# Patient Record
Sex: Male | Born: 1965 | Race: Black or African American | Hispanic: No | Marital: Married | Smoking: Never smoker
Health system: Southern US, Community
[De-identification: ages and names within clinical notes are randomized; demographics above are authoritative.]

## PROBLEM LIST (undated history)

## (undated) DIAGNOSIS — M1990 Unspecified osteoarthritis, unspecified site: Secondary | ICD-10-CM

## (undated) DIAGNOSIS — M87051 Idiopathic aseptic necrosis of right femur: Secondary | ICD-10-CM

## (undated) DIAGNOSIS — M2342 Loose body in knee, left knee: Secondary | ICD-10-CM

## (undated) DIAGNOSIS — S060XAA Concussion with loss of consciousness status unknown, initial encounter: Secondary | ICD-10-CM

## (undated) DIAGNOSIS — S060X9A Concussion with loss of consciousness of unspecified duration, initial encounter: Secondary | ICD-10-CM

## (undated) DIAGNOSIS — K219 Gastro-esophageal reflux disease without esophagitis: Secondary | ICD-10-CM

---

## 2003-01-09 HISTORY — PX: ANKLE FUSION: SHX881

## 2012-06-18 ENCOUNTER — Encounter (HOSPITAL_COMMUNITY): Payer: Self-pay | Admitting: Pharmacy Technician

## 2012-06-23 ENCOUNTER — Encounter (HOSPITAL_COMMUNITY)
Admission: RE | Admit: 2012-06-23 | Discharge: 2012-06-23 | Disposition: A | Discharge status: Home / Self Care | Payer: Worker's Compensation | Source: Ambulatory Visit | Attending: Orthopedic Surgery | Admitting: Orthopedic Surgery

## 2012-06-23 ENCOUNTER — Encounter (HOSPITAL_COMMUNITY): Payer: Self-pay

## 2012-06-23 HISTORY — DX: Unspecified osteoarthritis, unspecified site: M19.90

## 2012-06-23 HISTORY — DX: Concussion with loss of consciousness of unspecified duration, initial encounter: S06.0X9A

## 2012-06-23 HISTORY — DX: Concussion with loss of consciousness status unknown, initial encounter: S06.0XAA

## 2012-06-23 HISTORY — DX: Gastro-esophageal reflux disease without esophagitis: K21.9

## 2012-06-23 LAB — CBC
HCT: 42.1 % (ref 39.0–52.0)
Hemoglobin: 13.9 g/dL (ref 13.0–17.0)
MCH: 23.6 pg — ABNORMAL LOW (ref 26.0–34.0)
MCHC: 33 g/dL (ref 30.0–36.0)
MCV: 71.4 fL — ABNORMAL LOW (ref 78.0–100.0)
Platelets: 112 K/uL — ABNORMAL LOW (ref 150–400)
RBC: 5.9 MIL/uL — ABNORMAL HIGH (ref 4.22–5.81)
RDW: 14.5 % (ref 11.5–15.5)
WBC: 4.8 K/uL (ref 4.0–10.5)

## 2012-06-23 LAB — TYPE AND SCREEN
ABO/RH(D): O POS
Antibody Screen: NEGATIVE

## 2012-06-23 LAB — SURGICAL PCR SCREEN: MRSA, PCR: NEGATIVE

## 2012-06-23 LAB — ABO/RH: ABO/RH(D): O POS

## 2012-06-23 NOTE — Pre-Procedure Instructions (Signed)
Jason Sanchez  06/23/2012   Your procedure is scheduled on:  Tuesday, June 24th.  Report to [facility] at 5;30 AM.  Call this number if you have problems the morning of surgery: [phone]   Remember:   Do not eat food or drink liquids after midnight.    Take these medicines the morning of surgery with A SIP OF WATER: May take if needed:oxyCODONE-acetaminophen (PERCOCET/ROXICET).    Do not wear jewelry, make-up or nail polish.  Do not wear lotions, powders, or perfumes. You may wear deodorant.   Men may shave face and neck.  Do not bring valuables to the hospital.  [facility] is not responsible  for any belongings or valuables.  Contacts, dentures or bridgework may not be worn into surgery.  Leave suitcase in the car. After surgery it may be brought to your room.  For patients admitted to the hospital, checkout time is 11:00 AM the day of discharge.    Special Instructions: Shower with Special Wash you were given Sunday, June 22n  And Monday, June 23rd.  If you choose to take a shower the morning of surgery use the wash as instructed for Sunday and Monday.   Please read over the following fact sheets that you were given: Pain Booklet, Coughing and Deep Breathing, Blood Transfusion Information and Surgical Site Infection Prevention

## 2012-06-24 ENCOUNTER — Other Ambulatory Visit: Payer: Self-pay | Admitting: Orthopedic Surgery

## 2012-06-30 MED ORDER — VANCOMYCIN HCL IN DEXTROSE 1-5 GM/200ML-% IV SOLN
1000.0000 mg | INTRAVENOUS | Status: AC
  Administered 2012-07-01: 1000 mg via INTRAVENOUS
  Filled 2012-06-30: qty 200

## 2012-07-01 ENCOUNTER — Encounter (HOSPITAL_COMMUNITY): Payer: Self-pay | Admitting: Surgery

## 2012-07-01 ENCOUNTER — Inpatient Hospital Stay (HOSPITAL_COMMUNITY)
Admission: RE | Admit: 2012-07-01 | Discharge: 2012-07-03 | DRG: 470 | Disposition: A | Discharge status: Home Health | Payer: Worker's Compensation | Source: Ambulatory Visit | Attending: Orthopedic Surgery | Admitting: Orthopedic Surgery

## 2012-07-01 ENCOUNTER — Encounter (HOSPITAL_COMMUNITY): Payer: Self-pay | Admitting: Anesthesiology

## 2012-07-01 ENCOUNTER — Inpatient Hospital Stay (HOSPITAL_COMMUNITY): Payer: Worker's Compensation | Admitting: Anesthesiology

## 2012-07-01 ENCOUNTER — Encounter (HOSPITAL_COMMUNITY)
Admission: RE | Disposition: A | Discharge status: Home Health | Payer: Self-pay | Source: Ambulatory Visit | Attending: Orthopedic Surgery

## 2012-07-01 ENCOUNTER — Inpatient Hospital Stay (HOSPITAL_COMMUNITY): Payer: Worker's Compensation

## 2012-07-01 DIAGNOSIS — Z01812 Encounter for preprocedural laboratory examination: Secondary | ICD-10-CM

## 2012-07-01 DIAGNOSIS — Z79899 Other long term (current) drug therapy: Secondary | ICD-10-CM

## 2012-07-01 DIAGNOSIS — M87051 Idiopathic aseptic necrosis of right femur: Secondary | ICD-10-CM

## 2012-07-01 DIAGNOSIS — K219 Gastro-esophageal reflux disease without esophagitis: Secondary | ICD-10-CM | POA: Diagnosis present

## 2012-07-01 DIAGNOSIS — M87059 Idiopathic aseptic necrosis of unspecified femur: Principal | ICD-10-CM | POA: Diagnosis present

## 2012-07-01 HISTORY — DX: Idiopathic aseptic necrosis of right femur: M87.051

## 2012-07-01 HISTORY — PX: TOTAL HIP ARTHROPLASTY: SHX124

## 2012-07-01 LAB — CBC
HCT: 40.7 % (ref 39.0–52.0)
MCV: 71.3 fL — ABNORMAL LOW (ref 78.0–100.0)
Platelets: 96 10*3/uL — ABNORMAL LOW (ref 150–400)
RBC: 5.71 MIL/uL (ref 4.22–5.81)
WBC: 4.5 10*3/uL (ref 4.0–10.5)

## 2012-07-01 LAB — BASIC METABOLIC PANEL
BUN: 17 mg/dL (ref 6–23)
CO2: 26 mEq/L (ref 19–32)
Chloride: 107 mEq/L (ref 96–112)
Creatinine, Ser: 1.09 mg/dL (ref 0.50–1.35)
Potassium: 4.1 mEq/L (ref 3.5–5.1)

## 2012-07-01 LAB — PROTIME-INR: INR: 1.09 (ref 0.00–1.49)

## 2012-07-01 SURGERY — ARTHROPLASTY, HIP, TOTAL,POSTERIOR APPROACH
Anesthesia: General | Site: Hip | Laterality: Right | Wound class: Clean

## 2012-07-01 MED ORDER — METHOCARBAMOL 500 MG PO TABS
500.0000 mg | ORAL_TABLET | Freq: Four times a day (QID) | ORAL | Status: DC | PRN
  Administered 2012-07-01 – 2012-07-03 (×5): 500 mg via ORAL
  Filled 2012-07-01 (×4): qty 1

## 2012-07-01 MED ORDER — METHOCARBAMOL 100 MG/ML IJ SOLN
500.0000 mg | Freq: Four times a day (QID) | INTRAVENOUS | Status: DC | PRN
  Filled 2012-07-01: qty 5

## 2012-07-01 MED ORDER — MIDAZOLAM HCL 5 MG/5ML IJ SOLN
INTRAMUSCULAR | Status: DC | PRN
  Administered 2012-07-01: 2 mg via INTRAVENOUS

## 2012-07-01 MED ORDER — METHOCARBAMOL 500 MG PO TABS: 500.0000 mg | ORAL_TABLET | Freq: Four times a day (QID) | ORAL | Status: DC

## 2012-07-01 MED ORDER — HYDROMORPHONE HCL PF 1 MG/ML IJ SOLN
INTRAMUSCULAR | Status: AC
  Filled 2012-07-01: qty 2

## 2012-07-01 MED ORDER — METOCLOPRAMIDE HCL 10 MG PO TABS
5.0000 mg | ORAL_TABLET | Freq: Three times a day (TID) | ORAL | Status: DC | PRN
Start: 2012-07-01 — End: 2012-07-03

## 2012-07-01 MED ORDER — VANCOMYCIN HCL IN DEXTROSE 1-5 GM/200ML-% IV SOLN
1000.0000 mg | Freq: Two times a day (BID) | INTRAVENOUS | Status: AC
  Administered 2012-07-01: 1000 mg via INTRAVENOUS
  Filled 2012-07-01: qty 200

## 2012-07-01 MED ORDER — ROCURONIUM BROMIDE 100 MG/10ML IV SOLN
INTRAVENOUS | Status: DC | PRN
  Administered 2012-07-01: 70 mg via INTRAVENOUS

## 2012-07-01 MED ORDER — FENTANYL CITRATE 0.05 MG/ML IJ SOLN
INTRAMUSCULAR | Status: DC | PRN
  Administered 2012-07-01: 50 ug via INTRAVENOUS
  Administered 2012-07-01: 100 ug via INTRAVENOUS
  Administered 2012-07-01: 250 ug via INTRAVENOUS
  Administered 2012-07-01: 100 ug via INTRAVENOUS

## 2012-07-01 MED ORDER — ENOXAPARIN SODIUM 40 MG/0.4ML ~~LOC~~ SOLN: 40.0000 mg | SUBCUTANEOUS | Status: DC

## 2012-07-01 MED ORDER — ONDANSETRON HCL 4 MG/2ML IJ SOLN
INTRAMUSCULAR | Status: DC | PRN
  Administered 2012-07-01: 4 mg via INTRAVENOUS

## 2012-07-01 MED ORDER — PROPOFOL 10 MG/ML IV BOLUS
INTRAVENOUS | Status: DC | PRN
  Administered 2012-07-01: 50 mg via INTRAVENOUS
  Administered 2012-07-01: 200 mg via INTRAVENOUS

## 2012-07-01 MED ORDER — DEXAMETHASONE SODIUM PHOSPHATE 4 MG/ML IJ SOLN
INTRAMUSCULAR | Status: DC | PRN
  Administered 2012-07-01: 4 mg via INTRAVENOUS

## 2012-07-01 MED ORDER — OXYCODONE HCL 5 MG/5ML PO SOLN: 5.0000 mg | Freq: Once | ORAL | Status: DC | PRN

## 2012-07-01 MED ORDER — PROMETHAZINE HCL 25 MG/ML IJ SOLN: 6.2500 mg | INTRAMUSCULAR | Status: DC | PRN

## 2012-07-01 MED ORDER — HYDROMORPHONE HCL PF 1 MG/ML IJ SOLN
0.2500 mg | INTRAMUSCULAR | Status: DC | PRN
  Administered 2012-07-01 (×4): 0.5 mg via INTRAVENOUS

## 2012-07-01 MED ORDER — ONDANSETRON HCL 4 MG PO TABS
4.0000 mg | ORAL_TABLET | Freq: Four times a day (QID) | ORAL | Status: DC | PRN
  Administered 2012-07-02: 4 mg via ORAL
  Filled 2012-07-01: qty 1

## 2012-07-01 MED ORDER — OXYCODONE-ACETAMINOPHEN 10-325 MG PO TABS: 1.0000 | ORAL_TABLET | Freq: Four times a day (QID) | ORAL | Status: DC | PRN

## 2012-07-01 MED ORDER — ENOXAPARIN SODIUM 40 MG/0.4ML ~~LOC~~ SOLN
40.0000 mg | SUBCUTANEOUS | Status: DC
  Administered 2012-07-01 – 2012-07-02 (×2): 40 mg via SUBCUTANEOUS
  Filled 2012-07-01 (×3): qty 0.4

## 2012-07-01 MED ORDER — BUPIVACAINE HCL (PF) 0.25 % IJ SOLN
INTRAMUSCULAR | Status: AC
  Filled 2012-07-01: qty 30

## 2012-07-01 MED ORDER — ALUM & MAG HYDROXIDE-SIMETH 200-200-20 MG/5ML PO SUSP: 30.0000 mL | ORAL | Status: DC | PRN

## 2012-07-01 MED ORDER — MAGNESIUM CITRATE PO SOLN
1.0000 | Freq: Once | ORAL | Status: AC | PRN
  Filled 2012-07-01: qty 296

## 2012-07-01 MED ORDER — SENNA-DOCUSATE SODIUM 8.6-50 MG PO TABS: 2.0000 | ORAL_TABLET | Freq: Every day | ORAL | Status: DC

## 2012-07-01 MED ORDER — DIPHENHYDRAMINE HCL 12.5 MG/5ML PO ELIX: 12.5000 mg | ORAL_SOLUTION | ORAL | Status: DC | PRN

## 2012-07-01 MED ORDER — MENTHOL 3 MG MT LOZG
1.0000 | LOZENGE | OROMUCOSAL | Status: DC | PRN
  Administered 2012-07-03: 3 mg via ORAL
  Filled 2012-07-01: qty 9

## 2012-07-01 MED ORDER — MIDAZOLAM HCL 2 MG/2ML IJ SOLN
INTRAMUSCULAR | Status: AC
  Filled 2012-07-01: qty 2

## 2012-07-01 MED ORDER — SODIUM CHLORIDE 0.9 % IR SOLN
Status: DC | PRN
  Administered 2012-07-01: 1000 mL

## 2012-07-01 MED ORDER — NEOSTIGMINE METHYLSULFATE 1 MG/ML IJ SOLN
INTRAMUSCULAR | Status: DC | PRN
  Administered 2012-07-01: 3 mg via INTRAVENOUS

## 2012-07-01 MED ORDER — BUPIVACAINE HCL 0.25 % IJ SOLN
INTRAMUSCULAR | Status: DC | PRN
  Administered 2012-07-01: 10 mL

## 2012-07-01 MED ORDER — POTASSIUM CHLORIDE IN NACL 20-0.45 MEQ/L-% IV SOLN
INTRAVENOUS | Status: DC
  Administered 2012-07-01: 75 mL via INTRAVENOUS
  Administered 2012-07-02: 07:00:00 via INTRAVENOUS
  Filled 2012-07-01 (×7): qty 1000

## 2012-07-01 MED ORDER — ACETAMINOPHEN 650 MG RE SUPP: 650.0000 mg | Freq: Four times a day (QID) | RECTAL | Status: DC | PRN

## 2012-07-01 MED ORDER — OXYCODONE HCL 5 MG PO TABS
5.0000 mg | ORAL_TABLET | ORAL | Status: DC | PRN
  Administered 2012-07-01 – 2012-07-02 (×5): 10 mg via ORAL
  Administered 2012-07-02: 5 mg via ORAL
  Administered 2012-07-03: 10 mg via ORAL
  Filled 2012-07-01 (×7): qty 2

## 2012-07-01 MED ORDER — BISACODYL 10 MG RE SUPP: 10.0000 mg | Freq: Every day | RECTAL | Status: DC | PRN

## 2012-07-01 MED ORDER — HYDROMORPHONE HCL PF 1 MG/ML IJ SOLN
1.0000 mg | INTRAMUSCULAR | Status: DC | PRN
  Administered 2012-07-01 – 2012-07-02 (×5): 1 mg via INTRAVENOUS
  Filled 2012-07-01 (×5): qty 1

## 2012-07-01 MED ORDER — DOCUSATE SODIUM 100 MG PO CAPS
100.0000 mg | ORAL_CAPSULE | Freq: Two times a day (BID) | ORAL | Status: DC
  Administered 2012-07-01 – 2012-07-03 (×4): 100 mg via ORAL
  Filled 2012-07-01 (×5): qty 1

## 2012-07-01 MED ORDER — LACTATED RINGERS IV SOLN
INTRAVENOUS | Status: DC | PRN
  Administered 2012-07-01 (×3): via INTRAVENOUS

## 2012-07-01 MED ORDER — LIDOCAINE HCL (CARDIAC) 20 MG/ML IV SOLN
INTRAVENOUS | Status: DC | PRN
  Administered 2012-07-01: 20 mg via INTRAVENOUS

## 2012-07-01 MED ORDER — OXYCODONE HCL 5 MG PO TABS: 5.0000 mg | ORAL_TABLET | Freq: Once | ORAL | Status: DC | PRN

## 2012-07-01 MED ORDER — ONDANSETRON HCL 4 MG/2ML IJ SOLN: 4.0000 mg | Freq: Four times a day (QID) | INTRAMUSCULAR | Status: DC | PRN

## 2012-07-01 MED ORDER — ACETAMINOPHEN 325 MG PO TABS
650.0000 mg | ORAL_TABLET | Freq: Four times a day (QID) | ORAL | Status: DC | PRN
  Administered 2012-07-03: 650 mg via ORAL
  Filled 2012-07-01: qty 2

## 2012-07-01 MED ORDER — SENNA 8.6 MG PO TABS
1.0000 | ORAL_TABLET | Freq: Two times a day (BID) | ORAL | Status: DC
  Administered 2012-07-01 – 2012-07-03 (×4): 8.6 mg via ORAL
  Filled 2012-07-01 (×6): qty 1

## 2012-07-01 MED ORDER — PHENOL 1.4 % MT LIQD: 1.0000 | OROMUCOSAL | Status: DC | PRN

## 2012-07-01 MED ORDER — GLYCOPYRROLATE 0.2 MG/ML IJ SOLN
INTRAMUSCULAR | Status: DC | PRN
  Administered 2012-07-01: .4 mg via INTRAVENOUS

## 2012-07-01 MED ORDER — MIDAZOLAM HCL 2 MG/2ML IJ SOLN
0.5000 mg | Freq: Once | INTRAMUSCULAR | Status: AC | PRN
  Administered 2012-07-01: 2 mg via INTRAVENOUS

## 2012-07-01 MED ORDER — METOCLOPRAMIDE HCL 5 MG/ML IJ SOLN
5.0000 mg | Freq: Three times a day (TID) | INTRAMUSCULAR | Status: DC | PRN
Start: 2012-07-01 — End: 2012-07-03

## 2012-07-01 MED ORDER — POLYETHYLENE GLYCOL 3350 17 G PO PACK: 17.0000 g | PACK | Freq: Every day | ORAL | Status: DC | PRN

## 2012-07-01 MED ORDER — MEPERIDINE HCL 25 MG/ML IJ SOLN: 6.2500 mg | INTRAMUSCULAR | Status: DC | PRN

## 2012-07-01 SURGICAL SUPPLY — 58 items
BENZOIN TINCTURE PRP APPL 2/3 (GAUZE/BANDAGES/DRESSINGS) ×2 IMPLANT
BLADE SAW SAG 73X25 THK (BLADE) ×1
BLADE SAW SGTL 73X25 THK (BLADE) ×1 IMPLANT
BRUSH FEMORAL CANAL (MISCELLANEOUS) IMPLANT
CAPT HIP PF COP ×2 IMPLANT
CLOTH BEACON ORANGE TIMEOUT ST (SAFETY) ×2 IMPLANT
CLSR STERI-STRIP ANTIMIC 1/2X4 (GAUZE/BANDAGES/DRESSINGS) ×2 IMPLANT
COVER BACK TABLE 24X17X13 BIG (DRAPES) IMPLANT
COVER SURGICAL LIGHT HANDLE (MISCELLANEOUS) ×2 IMPLANT
DRAPE INCISE IOBAN 66X45 STRL (DRAPES) ×2 IMPLANT
DRAPE ORTHO SPLIT 77X108 STRL (DRAPES) ×2
DRAPE PROXIMA HALF (DRAPES) ×2 IMPLANT
DRAPE SURG ORHT 6 SPLT 77X108 (DRAPES) ×2 IMPLANT
DRAPE U-SHAPE 47X51 STRL (DRAPES) ×2 IMPLANT
DRILL BIT 5/64 (BIT) ×2 IMPLANT
DRSG MEPILEX BORDER 4X12 (GAUZE/BANDAGES/DRESSINGS) IMPLANT
DRSG MEPILEX BORDER 4X8 (GAUZE/BANDAGES/DRESSINGS) ×2 IMPLANT
DRSG PAD ABDOMINAL 8X10 ST (GAUZE/BANDAGES/DRESSINGS) ×4 IMPLANT
DURAPREP 26ML APPLICATOR (WOUND CARE) ×2 IMPLANT
ELECT CAUTERY BLADE 6.4 (BLADE) ×2 IMPLANT
ELECT REM PT RETURN 9FT ADLT (ELECTROSURGICAL) ×2
ELECTRODE REM PT RTRN 9FT ADLT (ELECTROSURGICAL) ×1 IMPLANT
GLOVE BIOGEL PI ORTHO PRO SZ8 (GLOVE) ×1
GLOVE ORTHO TXT STRL SZ7.5 (GLOVE) ×2 IMPLANT
GLOVE PI ORTHO PRO STRL SZ8 (GLOVE) ×1 IMPLANT
GLOVE SURG ORTHO 8.0 STRL STRW (GLOVE) ×4 IMPLANT
GOWN STRL NON-REIN LRG LVL3 (GOWN DISPOSABLE) ×2 IMPLANT
HANDPIECE INTERPULSE COAX TIP (DISPOSABLE)
HOOD PEEL AWAY FACE SHEILD DIS (HOOD) ×4 IMPLANT
KIT BASIN OR (CUSTOM PROCEDURE TRAY) ×2 IMPLANT
KIT ROOM TURNOVER OR (KITS) ×2 IMPLANT
MANIFOLD NEPTUNE II (INSTRUMENTS) ×2 IMPLANT
NEEDLE HYPO 25GX1X1/2 BEV (NEEDLE) ×2 IMPLANT
NS IRRIG 1000ML POUR BTL (IV SOLUTION) ×2 IMPLANT
PACK TOTAL JOINT (CUSTOM PROCEDURE TRAY) ×2 IMPLANT
PAD ARMBOARD 7.5X6 YLW CONV (MISCELLANEOUS) ×4 IMPLANT
PILLOW ABDUCTION HIP (SOFTGOODS) ×2 IMPLANT
PRESSURIZER FEMORAL UNIV (MISCELLANEOUS) IMPLANT
RETRIEVER SUT HEWSON (MISCELLANEOUS) ×2 IMPLANT
SET HNDPC FAN SPRY TIP SCT (DISPOSABLE) IMPLANT
SPONGE GAUZE 4X4 12PLY (GAUZE/BANDAGES/DRESSINGS) ×2 IMPLANT
SPONGE LAP 4X18 X RAY DECT (DISPOSABLE) IMPLANT
STRIP CLOSURE SKIN 1/2X4 (GAUZE/BANDAGES/DRESSINGS) ×4 IMPLANT
SUCTION FRAZIER TIP 10 FR DISP (SUCTIONS) ×2 IMPLANT
SUT FIBERWIRE #2 38 REV NDL BL (SUTURE) ×6
SUT MNCRL AB 4-0 PS2 18 (SUTURE) IMPLANT
SUT VIC AB 0 CT1 27 (SUTURE) ×1
SUT VIC AB 0 CT1 27XBRD ANBCTR (SUTURE) ×1 IMPLANT
SUT VIC AB 2-0 CT1 27 (SUTURE) ×1
SUT VIC AB 2-0 CT1 TAPERPNT 27 (SUTURE) ×1 IMPLANT
SUT VIC AB 3-0 SH 18 (SUTURE) ×2 IMPLANT
SUTURE FIBERWR#2 38 REV NDL BL (SUTURE) ×3 IMPLANT
SYR CONTROL 10ML LL (SYRINGE) ×2 IMPLANT
TOWEL OR 17X24 6PK STRL BLUE (TOWEL DISPOSABLE) ×2 IMPLANT
TOWEL OR 17X26 10 PK STRL BLUE (TOWEL DISPOSABLE) ×2 IMPLANT
TOWER CARTRIDGE SMART MIX (DISPOSABLE) IMPLANT
TRAY FOLEY CATH 14FR (SET/KITS/TRAYS/PACK) ×2 IMPLANT
WATER STERILE IRR 1000ML POUR (IV SOLUTION) ×8 IMPLANT

## 2012-07-01 NOTE — Op Note (Signed)
07/01/2012  9:41 AM  PATIENT:  Jason Sanchez   MRN: [id number]  PRE-OPERATIVE DIAGNOSIS:  RIGHT HIP AVN,FRACTURE  POST-OPERATIVE DIAGNOSIS:  RIGHT HIP AVN,FRACTURE  PROCEDURE:  Procedure(s): TOTAL HIP ARTHROPLASTY  PREOPERATIVE INDICATIONS:    Jason Sanchez is an 47 y.o. male who has a diagnosis of Avascular necrosis of bone of right hip and elected for surgical management after failing conservative treatment.  The risks benefits and alternatives were discussed with the patient including but not limited to the risks of nonoperative treatment, versus surgical intervention including infection, bleeding, nerve injury, periprosthetic fracture, the need for revision surgery, dislocation, leg length discrepancy, blood clots, cardiopulmonary complications, morbidity, mortality, among others, and they were willing to proceed.  This happened after he had a fall while in the [employer]. This probably initiated a subcortical fracture, that cause rapid progression of his avascular necrosis and severe hip pain. This initiated a cycle of severe pain that to 2 over the edge, ultimately requiring a total hip arthroplasty.   OPERATIVE REPORT     SURGEON:  Teryl Lucy, MD    ASSISTANT:  Janace Litten, OPA-C  (Present throughout the entire procedure,  necessary for completion of procedure in a timely manner, assisting with retraction, instrumentation, and closure)     ANESTHESIA:  General    COMPLICATIONS:  None.     COMPONENTS:  [employer] fit high offset femur size 6 with a 36 mm +8.5 head ball and a gription acetabular shell size 54 with a 10 degree lipped polyethylene liner    PROCEDURE IN DETAIL:   The patient was met in the holding area and  identified.  The appropriate hip was identified and marked at the operative site.  The patient was then transported to the OR  and  placed under general anesthesia.  At that point, the patient was  placed in the lateral decubitus position with the operative  side up and  secured to the operating room table and all bony prominences padded.     The operative lower extremity was prepped from the iliac crest to the distal leg.  Sterile draping was performed.  Time out was performed prior to incision.      A routine posterolateral approach was utilized via sharp dissection  carried down to the subcutaneous tissue.  Gross bleeders were Bovie coagulated.  The iliotibial band was identified and incised along the length of the skin incision.  Self-retaining retractors were  inserted.  With the hip internally rotated, the short external rotators  were identified. The piriformis and capsule was tagged with FiberWire, and the hip capsule released in a T-type fashion.  The femoral neck was exposed, and I resected the femoral neck using the appropriate jig. This was performed at approximately a thumb's breadth above the lesser trochanter.    I then exposed the deep acetabulum, cleared out any tissue including the ligamentum teres.  A wing retractor was placed.  After adequate visualization, I excised the labrum, and then sequentially reamed.  I placed the trial acetabulum, which seated nicely, and then impacted the real cup into place.  Appropriate version and inclination was confirmed clinically matching their bony anatomy, and also with the use of the jig. He had a superior aspect of the acetabulum that had a bony defect from the avascular necrosis and progressive destruction. This was essentially bridged, and I had good anterior and posterior and medial coverage of the cup.  A trial polyethylene liner was placed and the wing retractor  removed.    I then prepared the proximal femur using the cookie-cutter, the lateralizing reamer, and then sequentially reamed and broached.  A trial broach, neck, and head was utilized, and I reduced the hip and it was found to have excellent stability with functional range of motion. The standard offset was a little bit loose, and the  8.5 mm length restored length best, and stability was optimized. The trial components were then removed, and the real polyethylene liner was placed with the lip directed posteriorly.  I then impacted the real femoral prosthesis into place into the appropriate version, slightly anteverted to the normal anatomy, and I impacted the real head ball into place. The hip was then reduced and taken through functional range of motion and found to have excellent stability. Leg lengths were restored.  I then used a 2 mm drill bits to pass the FiberWire suture from the capsule and piriformis through the greater trochanter, and secured this. Excellent posterior capsular repair was achieved. I also closed the T in the capsule.  I then irrigated the hip copiously again with pulse lavage, and repaired the fascia with Vicryl, followed by Vicryl for the subcutaneous tissue, Monocryl for the skin, Steri-Strips and sterile gauze. The wounds were injected. The patient was then awakened and returned to PACU in stable and satisfactory condition. There were no complications.  Teryl Lucy, MD Orthopedic Surgeon [phone]   07/01/2012 9:41 AM

## 2012-07-01 NOTE — Anesthesia Preprocedure Evaluation (Signed)
Anesthesia Evaluation  Patient identified by MRN, date of birth, ID band Patient awake    Reviewed: Allergy & Precautions, H&P , NPO status , Patient's Chart, lab work & pertinent test results  History of Anesthesia Complications Negative for: history of anesthetic complications  Airway Mallampati: I TM Distance: >3 FB Neck ROM: Full    Dental  (+) Missing and Dental Advisory Given   Pulmonary neg pulmonary ROS,  breath sounds clear to auscultation  Pulmonary exam normal       Cardiovascular negative cardio ROS  Rhythm:Regular Rate:Normal     Neuro/Psych negative neurological ROS     GI/Hepatic Neg liver ROS, GERD-  Controlled,  Endo/Other  negative endocrine ROS  Renal/GU negative Renal ROS     Musculoskeletal  (+) Arthritis -, Osteoarthritis,    Abdominal   Peds  Hematology   Anesthesia Other Findings   Reproductive/Obstetrics                           Anesthesia Physical Anesthesia Plan  ASA: II  Anesthesia Plan: General   Post-op Pain Management:    Induction: Intravenous  Airway Management Planned: Oral ETT  Additional Equipment:   Intra-op Plan:   Post-operative Plan: Extubation in OR  Informed Consent: I have reviewed the patients History and Physical, chart, labs and discussed the procedure including the risks, benefits and alternatives for the proposed anesthesia with the patient or authorized representative who has indicated his/her understanding and acceptance.   Dental advisory given  Plan Discussed with: Surgeon and CRNA  Anesthesia Plan Comments: (Plan routine monitors, GETA)        Anesthesia Quick Evaluation

## 2012-07-01 NOTE — Progress Notes (Signed)
Patient transfer from PACU to 5 North at 1133.  Report received from PACU nurse, patient drowsy, vs stable.  Side rails up x3, call bell within reach.

## 2012-07-01 NOTE — Preoperative (Signed)
Beta Blockers   Reason not to administer Beta Blockers:Not Applicable 

## 2012-07-01 NOTE — Anesthesia Postprocedure Evaluation (Signed)
  Anesthesia Post-op Note  Patient: Jason Sanchez  Procedure(s) Performed: Procedure(s): TOTAL HIP ARTHROPLASTY (Right)  Patient Location: PACU  Anesthesia Type:General  Level of Consciousness: awake, alert , oriented and patient cooperative  Airway and Oxygen Therapy: Patient Spontanous Breathing  Post-op Pain: mild  Post-op Assessment: Post-op Vital signs reviewed, Patient's Cardiovascular Status Stable, Respiratory Function Stable, Patent Airway, No signs of Nausea or vomiting and Pain level controlled  Post-op Vital Signs: Reviewed and stable  Complications: No apparent anesthesia complications

## 2012-07-01 NOTE — Evaluation (Signed)
Physical Therapy Evaluation Patient Details Name: Jason Sanchez MRN: [id number] DOB: 1965-08-03 Today's Date: 07/01/2012 Time: 9562-1308 PT Time Calculation (min): 30 min  PT Assessment / Plan / Recommendation Clinical Impression  Pt is 47 y/o male admitted for s/p left posterior THA on POD #0.  Pt moving well and will benefit from acute PT services to improve overall mobility and prepare for safe d/c home.     PT Assessment  Patient needs continued PT services    Follow Up Recommendations  Home health PT    Equipment Recommendations  Rolling walker with 5" wheels;3in1 (PT)    Frequency 7X/week    Precautions / Restrictions Precautions Precautions: Posterior Hip Precaution Booklet Issued: Yes (comment) Precaution Comments: Reviewed 3/3 hip posterior hip precautions Restrictions Weight Bearing Restrictions: Yes RLE Weight Bearing: Weight bearing as tolerated   Pertinent Vitals/Pain 4/10 right hip pain      Mobility  Bed Mobility Bed Mobility: Sit to Supine Sit to Supine: 4: Min assist Details for Bed Mobility Assistance: (A) with right LE OOB with cues for technique.  Heavy use of rail to assist with mobility Transfers Transfers: Sit to Stand;Stand to Sit Sit to Stand: 4: Min assist;From bed Stand to Sit: 4: Min assist;To chair/3-in-1 Details for Transfer Assistance: (A) to initiate transfer with cues for hand placement Ambulation/Gait Ambulation/Gait Assistance: 4: Min assist Ambulation Distance (Feet): 5 Feet Assistive device: Rolling walker Ambulation/Gait Assistance Details: (A) to maintain balance with cues for proper step sequence Gait Pattern: Step-to pattern;Decreased step length - right;Decreased stance time - left;Antalgic Stairs: No Wheelchair Mobility Wheelchair Mobility: No    Exercises Total Joint Exercises Ankle Circles/Pumps: Both;10 reps;AAROM   PT Diagnosis: Difficulty walking;Generalized weakness;Acute pain  PT Problem List: Decreased  strength;Decreased range of motion;Decreased activity tolerance;Decreased balance;Decreased mobility;Decreased knowledge of use of DME;Pain;Decreased knowledge of precautions PT Treatment Interventions: DME instruction;Gait training;Stair training;Functional mobility training;Therapeutic activities;Therapeutic exercise;Balance training;Patient/family education     PT Goals(Current goals can be found in the care plan section) Acute Rehab PT Goals Patient Stated Goal: To go home PT Goal Formulation: With patient/family Potential to Achieve Goals: Good  Visit Information  Last PT Received On: 07/01/12 Assistance Needed: +1 History of Present Illness: s/p right THA due to AVN        Prior Functioning  Home Living Family/patient expects to be discharged to:: Private residence Living Arrangements: Spouse/significant other;Children Available Help at Discharge: Family;Available 24 hours/day Type of Home: House Home Access: Stairs to enter [employer] of Steps: 3 Entrance Stairs-Rails: None Home Layout: Able to live on main level with bedroom/bathroom Home Equipment: None DO NOT USE:  Lives With: Spouse Prior Function Level of Independence: Independent DO NOT USE:  Able to Take Stairs?: Yes DO NOT USE:  Driving: Yes DO NOT USE: Vocation: Retired Musician: No difficulties Dominant Hand: Right    Cognition  Cognition DO NOT USE:  Arousal/Alertness: Awake/alert Behavior During Therapy: WFL for tasks assessed/performed Overall Cognitive Status: Within Functional Limits for tasks assessed    Extremity/Trunk Assessment Lower Extremity Assessment Lower Extremity Assessment: LLE deficits/detail LLE: Unable to fully assess due to pain   Balance    End of Session PT - End of Session Equipment Utilized During Treatment: Gait belt Activity Tolerance: Patient tolerated treatment well Patient left: in chair;with call bell/phone within reach Nurse  Communication: Mobility status  GP     Jason Sanchez 07/01/2012, 4:34 PM Jake Shark, PT DPT [phone]

## 2012-07-01 NOTE — Progress Notes (Signed)
UR COMPLETED  

## 2012-07-01 NOTE — Plan of Care (Signed)
Problem: Consults Goal: Diagnosis- Total Joint Replacement Primary Total Hip right     

## 2012-07-01 NOTE — Transfer of Care (Signed)
Immediate Anesthesia Transfer of Care Note  Patient: Jason Sanchez  Procedure(s) Performed: Procedure(s): TOTAL HIP ARTHROPLASTY (Right)  Patient Location: PACU  Anesthesia Type:General  Level of Consciousness: awake, alert  and oriented  Airway & Oxygen Therapy: Patient Spontanous Breathing and Patient connected to nasal cannula oxygen  Post-op Assessment: Report given to PACU RN and Post -op Vital signs reviewed and stable  Post vital signs: Reviewed and stable  Complications: No apparent anesthesia complications

## 2012-07-01 NOTE — H&P (Signed)
  PREOPERATIVE H&P  Chief Complaint: RIGHT HIP AVN,FRACTURE  HPI: Jason Sanchez is a 47 y.o. male who presents for preoperative history and physical with a diagnosis of RIGHT HIP AVN,FRACTURE/collapse of the femoral head. Symptoms are rated as moderate to severe, and have been worsening.  This is significantly impairing activities of daily living.  He has elected for surgical management.   Past Medical History  Diagnosis Date  . Concussion   . GERD (gastroesophageal reflux disease)   . Arthritis    Past Surgical History  Procedure Laterality Date  . Ankle fusion Right 2005    pinned and screws   History   Social History  . Marital Status: Married    Spouse Name: N/A    Number of Children: N/A  . Years of Education: N/A   Social History Main Topics  . Smoking status: Never Smoker   . Smokeless tobacco: Not on file  . Alcohol Use: No  . Drug Use: No  . Sexually Active: Not on file   Other Topics Concern  . Not on file   Social History Narrative  . No narrative on file   No family history on file.  Grandfather had heart disease.  Allergies  Allergen Reactions  . Penicillins Hives   Prior to Admission medications   Medication Sig Start Date End Date Taking? Authorizing Provider  celecoxib (CELEBREX) 200 MG capsule Take 200 mg by mouth 2 (two) times daily.   Yes Historical Provider, MD  oxyCODONE-acetaminophen (PERCOCET/ROXICET) 5-325 MG per tablet Take 1 tablet by mouth at bedtime as needed for pain.   Yes Historical Provider, MD     Positive ROS: All other systems have been reviewed and were otherwise negative with the exception of those mentioned in the HPI and as above.  Physical Exam: General: Alert, no acute distress Cardiovascular: No pedal edema Respiratory: No cyanosis, no use of accessory musculature GI: No organomegaly, abdomen is soft and non-tender Skin: No lesions in the area of chief complaint Neurologic: Sensation intact distally Psychiatric:  Patient is competent for consent with normal mood and affect Lymphatic: No axillary or cervical lymphadenopathy  MUSCULOSKELETAL: right hip with severe loss of motion, extreme pain with IR.  Slight leg length discrepancy.  Assessment: RIGHT HIP AVN   Plan: Plan for Procedure(s): TOTAL HIP ARTHROPLASTY  The risks benefits and alternatives were discussed with the patient including but not limited to the risks of nonoperative treatment, versus surgical intervention including infection, bleeding, nerve injury, periprosthetic fracture, the need for revision surgery, dislocation, leg length discrepancy, blood clots, cardiopulmonary complications, morbidity, mortality, among others, and they were willing to proceed.     Eulas Post, MD Cell [phone]   07/01/2012 6:20 AM

## 2012-07-02 ENCOUNTER — Encounter (HOSPITAL_COMMUNITY): Payer: Self-pay | Admitting: Orthopedic Surgery

## 2012-07-02 LAB — CBC
HCT: 32 % — ABNORMAL LOW (ref 39.0–52.0)
Hemoglobin: 10.4 g/dL — ABNORMAL LOW (ref 13.0–17.0)
RBC: 4.46 MIL/uL (ref 4.22–5.81)
RDW: 14.4 % (ref 11.5–15.5)
WBC: 7.2 10*3/uL (ref 4.0–10.5)

## 2012-07-02 LAB — BASIC METABOLIC PANEL
BUN: 13 mg/dL (ref 6–23)
CO2: 28 mEq/L (ref 19–32)
Chloride: 101 mEq/L (ref 96–112)
GFR calc Af Amer: >90 mL/min (ref >90)
Potassium: 4 mEq/L (ref 3.5–5.1)

## 2012-07-02 NOTE — Evaluation (Signed)
Occupational Therapy Evaluation Patient Details Name: Jason Sanchez MRN: [id number] DOB: 1965/04/26 Today's Date: 07/02/2012 Time: 4696-2952 OT Time Calculation (min): 20 min  OT Assessment / Plan / Recommendation Clinical Impression  Pt admitted for R THA (posterior) due to AVN.  Pt is moving well, somewhat limited by pain and nausea this session.  Educated in hip precautions related to ADL and began instruction in use of AE and multiple uses of 3 in 1.  Will follow acutely.    OT Assessment  Patient needs continued OT Services    Follow Up Recommendations  Home health OT;Supervision/Assistance - 24 hour    Barriers to Discharge      Equipment Recommendations  3 in 1 bedside comode    Recommendations for Other Services    Frequency  Min 2X/week    Precautions / Restrictions Precautions Precautions: Posterior Hip;Fall Precaution Booklet Issued: Yes (comment) Precaution Comments: Reviewed 3/3 hip posterior hip precautions Restrictions Weight Bearing Restrictions: Yes RLE Weight Bearing: Weight bearing as tolerated   Pertinent Vitals/Pain Did not rate R hip pain, premedicated, repositioned, iced    ADL  Eating/Feeding: Independent Where Assessed - Eating/Feeding: Chair Grooming: Wash/dry hands;Min guard Where Assessed - Grooming: Unsupported standing Upper Body Bathing: Set up Where Assessed - Upper Body Bathing: Unsupported sitting Lower Body Bathing: Maximal assistance Where Assessed - Lower Body Bathing: Unsupported sitting;Supported sit to stand Upper Body Dressing: Set up Where Assessed - Upper Body Dressing: Unsupported sitting Lower Body Dressing: Maximal assistance Where Assessed - Lower Body Dressing: Unsupported sitting;Supported sit to stand Toilet Transfer: Min Pension scheme manager Method: Sit to Barista: Raised toilet seat with arms (or 3-in-1 over toilet) Toileting - Clothing Manipulation and Hygiene: Supervision/safety Where  Assessed - Glass blower/designer Manipulation and Hygiene: Standing Equipment Used: Gait belt;Rolling walker;Sock aid;Long-handled shoe horn;Long-handled sponge;Reacher Transfers/Ambulation Related to ADLs: min guard assist ADL Comments: Instructed/showed pt AE for LB ADL.  Pt has workers comp for AE.    OT Diagnosis: Generalized weakness;Acute pain  OT Problem List: Decreased strength;Decreased activity tolerance;Impaired balance (sitting and/or standing);Decreased knowledge of use of DME or AE;Decreased knowledge of precautions;Pain OT Treatment Interventions: Self-care/ADL training;DME and/or AE instruction;Patient/family education   OT Goals(Current goals can be found in the care plan section) Acute Rehab OT Goals Patient Stated Goal: To go home OT Goal Formulation: With patient Time For Goal Achievement: 07/09/12 Potential to Achieve Goals: Good  Visit Information  Last OT Received On: 07/02/12 Assistance Needed: +1 History of Present Illness: s/p right THA due to AVN        Prior Functioning     Home Living Family/patient expects to be discharged to:: Private residence Living Arrangements: Spouse/significant other;Children Available Help at Discharge: Family;Available 24 hours/day Type of Home: House Home Access: Stairs to enter [employer] of Steps: 3 Entrance Stairs-Rails: None Home Layout: Able to live on main level with bedroom/bathroom Home Equipment: None Prior Function Level of Independence: Independent Communication Communication: No difficulties Dominant Hand: Right         Vision/Perception Vision - History Patient Visual Report: No change from baseline   Cognition  Cognition Arousal/Alertness: Awake/alert Behavior During Therapy: WFL for tasks assessed/performed Overall Cognitive Status: Within Functional Limits for tasks assessed    Extremity/Trunk Assessment Upper Extremity Assessment Upper Extremity Assessment: Overall WFL for tasks  assessed     Mobility Bed Mobility Bed Mobility: Not assessed    Transfers Transfers: Sit to Stand;Stand to Sit Sit to Stand: 4: Min guard;With upper extremity  assist;From chair/3-in-1 Stand to Sit: 4: Min guard;With upper extremity assist;To chair/3-in-1 Details for Transfer Assistance: cues for hand placement and R LE placement     Exercise   Balance     End of Session OT - End of Session Activity Tolerance:  (limited by pain and nausea) Patient left: in chair;with call bell/phone within reach  GO     Evern Bio 07/02/2012, 12:21 PM [phone]

## 2012-07-02 NOTE — Progress Notes (Signed)
Physical Therapy Treatment Patient Details Name: Jason Sanchez MRN: [id number] DOB: [birth year] Today's Date: 07/02/2012 Time:  -     PT Assessment / Plan / Recommendation  PT Comments   Patient is making good progress with PT.  From a mobility standpoint anticipate patient will be ready for DC home tomorrow if cleared by MD. Patient needs to practice stairs next session if able.         Follow Up Recommendations  Home health PT     Does the patient have the potential to tolerate intense rehabilitation     Barriers to Discharge        Equipment Recommendations  Rolling walker with 5" wheels;3in1 (PT)    Recommendations for Other Services    Frequency 7X/week   Progress towards PT Goals Progress towards PT goals: Progressing toward goals  Plan Current plan remains appropriate    Precautions / Restrictions Precautions Precautions: Posterior Hip Precaution Comments: Reviewed 3/3 hip posterior hip precautions Restrictions RLE Weight Bearing: Weight bearing as tolerated   Pertinent Vitals/Pain 2/10 hip RN provided medication to assist with pain control     Mobility  Bed Mobility Sit to Supine: 4: Min assist Details for Bed Mobility Assistance: (A) with right LE OOB with cues for technique.   Transfers Sit to Stand: 4: Min guard;With upper extremity assist;From bed Stand to Sit: To chair/3-in-1;4: Min guard;With upper extremity assist;With armrests Details for Transfer Assistance: cues for hand placement and R LE placement Ambulation/Gait Ambulation/Gait Assistance: 4: Min guard Ambulation Distance (Feet): 80 Feet Assistive device: Rolling walker Ambulation/Gait Assistance Details: Cues for step seqence and RW positioning Gait Pattern: Step-to pattern Gait velocity: decreased    Exercises Total Joint Exercises Heel Slides: AAROM;Right;10 reps Hip ABduction/ADduction: AAROM;Right;10 reps Long Arc Quad: AROM;Right;10 reps   PT Diagnosis:    PT Problem List:   PT  Treatment Interventions:     PT Goals    Visit Information  Last PT Received On: 07/02/12 Assistance Needed: +1 History of Present Illness: s/p right THA due to AVN     Subjective Data      Cognition  Cognition Arousal/Alertness: Awake/alert Behavior During Therapy: WFL for tasks assessed/performed Overall Cognitive Status: Within Functional Limits for tasks assessed    Balance     End of Session PT - End of Session Equipment Utilized During Treatment: Gait belt Activity Tolerance: Patient tolerated treatment well Patient left: in chair;with call bell/phone within reach   GP     Fredrich Birks 07/02/2012, 11:48 AM

## 2012-07-02 NOTE — Progress Notes (Signed)
Patient ID: Jason Sanchez, male   DOB: April 12, 1965, 47 y.o.   MRN: [id number]     Subjective:  Patient reports pain as mild to moderate.  Patient states that he had severe pain last night but is some better today  Objective:   VITALS:   Filed Vitals:   07/01/12 2100 07/02/12 0000 07/02/12 0400 07/02/12 0631  BP: 116/66   137/74  Pulse: 71   79  Temp: 99.8 F (37.7 C)   100.3 F (37.9 C)  TempSrc: Oral   Oral  Resp: 18 18 18 16   SpO2: 99% 99% 99% 97%    ABD soft Sensation intact distally Dorsiflexion/Plantar flexion intact Incision: dressing C/D/I and moderate drainage Dressing removed just bloody drainage no sign of infection.   Lab Results  Component Value Date   WBC 7.2 07/02/2012   HGB 10.4* 07/02/2012   HCT 32.0* 07/02/2012   MCV 71.7* 07/02/2012   PLT 75* 07/02/2012     Assessment/Plan: 1 Day Post-Op   Principal Problem:   Avascular necrosis of bone of right hip   Advance diet Up with therapy Dry dressing applied wound good Plan for DC tomorrow vs. Today depending on how he does with PT and pain control.   Haskel Khan 07/02/2012, 8:14 AM   Teryl Lucy, MD Cell [phone]

## 2012-07-02 NOTE — Care Management Note (Signed)
    Page 1 of 2   07/04/2012     10:03:52 AM   CARE MANAGEMENT NOTE 07/04/2012  Patient:  COWAN, PILAR   Account Number:  [id number]  Date Initiated:  07/02/2012  Documentation initiated by:  [facility]  Subjective/Objective Assessment:   admitted postop rt total hip arthroplasty     Action/Plan:   plan return home with [facility], HHPT, HHOT   Anticipated DC Date:  07/03/2012   Anticipated DC Plan:  HOME W HOME HEALTH SERVICES      DC Planning Services  CM consult      Choice offered to / List presented to:     DME arranged  3-N-1  WALKER - ROLLING      DME agency  [employer]        Status of service:  Completed, signed off Medicare Important Message given?   (If response is "NO", the following Medicare IM given date fields will be blank) Date Medicare IM given:   Date Additional Medicare IM given:    Discharge Disposition:  HOME W HOME HEALTH SERVICES  Per UR Regulation:  Reviewed for med. necessity/level of care/duration of stay  If discussed at Long Length of Stay Meetings, dates discussed:    Comments:  Workers Comp Case Manager Primus Bravo [phone] fax [phone]  07/04/12 Received call on 07/03/12 from [place] at [facility], requested call when patent d/c'd [phone]. Called Denita when patient d/c'd. 3N1 and rolling walker delivered to patient by [employer] prior to d/c. Jacquelynn Cree RN, BSN, CCM  07/02/12 Contacted  Cheryl on 07/01/12  and faxed orders for [facility], PT, and OT, 3N1, and rolling walker. Received call that [employer] will provide 3N1 and rolling walker, [facility] will provide HHC. Contacted [facility] [phone], spoke with Victorino Dike, they will be providing HHRN, HHPTand HHOT with start of care 07/03/12. Informed patient. CM will continue to follow. Jacquelynn Cree RN, BSN, CCM

## 2012-07-02 NOTE — Progress Notes (Signed)
Physical Therapy Treatment Patient Details Name: Deanta Mincey MRN: [id number] DOB: 01-21-1965 Today's Date: 07/02/2012 Time: 1191-4782 PT Time Calculation (min): 20 min  PT Assessment / Plan / Recommendation  PT Comments   Patient is making good progress with PT.  From a mobility standpoint anticipate patient will be ready for DC home tomorrow. Patient needs to practice stairs next session.      Follow Up Recommendations  Home health PT     Does the patient have the potential to tolerate intense rehabilitation     Barriers to Discharge        Equipment Recommendations  Rolling walker with 5" wheels;3in1 (PT)    Recommendations for Other Services    Frequency 7X/week   Progress towards PT Goals Progress towards PT goals: Progressing toward goals  Plan Current plan remains appropriate    Precautions / Restrictions Precautions Precautions: Posterior Hip;Fall Precaution Booklet Issued: Yes (comment) Precaution Comments: Reviewed 3/3 hip posterior hip precautions Restrictions Weight Bearing Restrictions: Yes RLE Weight Bearing: Weight bearing as tolerated   Pertinent Vitals/Pain 4/10 hip pain. patient repositioned for comfort     Mobility  Bed Mobility Bed Mobility: Sit to Supine;Supine to Sit Supine to Sit: 4: Min assist;With rails Sit to Supine: 4: Min assist Details for Bed Mobility Assistance: (A) with right LE OOB with cues for technique.   Transfers Sit to Stand: 4: Min guard;With upper extremity assist;From chair/3-in-1 Stand to Sit: 4: Min guard;With upper extremity assist;To chair/3-in-1 Details for Transfer Assistance: cues for hand placement and R LE placement Ambulation/Gait Ambulation/Gait Assistance: 4: Min guard Ambulation Distance (Feet): 200 Feet Assistive device: Rolling walker Ambulation/Gait Assistance Details: good step length and posture Gait Pattern: Step-through pattern;Decreased stride length Gait velocity: increasing    Exercises Total Joint  Exercises Heel Slides: AAROM;Right;10 reps Hip ABduction/ADduction: AAROM;Right;10 reps Long Arc Quad: AROM;Right;10 reps   PT Diagnosis:    PT Problem List:   PT Treatment Interventions:     PT Goals (current goals can now be found in the care plan section) Acute Rehab PT Goals Patient Stated Goal: To go home  Visit Information  Last PT Received On: 07/02/12 Assistance Needed: +1 History of Present Illness: s/p right THA due to AVN     Subjective Data  Patient Stated Goal: To go home   Cognition  Cognition Arousal/Alertness: Awake/alert Behavior During Therapy: WFL for tasks assessed/performed Overall Cognitive Status: Within Functional Limits for tasks assessed    Balance     End of Session PT - End of Session Equipment Utilized During Treatment: Gait belt Activity Tolerance: Patient tolerated treatment well Patient left: in chair;with call bell/phone within reach Nurse Communication: Mobility status   GP     Fredrich Birks 07/02/2012, 2:32 PM 07/02/2012 Fredrich Birks PTA [phone] pager [phone] office

## 2012-07-02 NOTE — Discharge Summary (Addendum)
Physician Discharge Summary  Patient ID: Jason Sanchez MRN: [id number] DOB/AGE: 1965/02/21 47 y.o.  Admit date: 07/01/2012 Discharge date: 07/03/2012  Admission Diagnoses:  Avascular necrosis of bone of right hip  Discharge Diagnoses:  Principal Problem:   Avascular necrosis of bone of right hip   Past Medical History  Diagnosis Date  . Concussion   . GERD (gastroesophageal reflux disease)   . Arthritis   . Avascular necrosis of bone of right hip 07/01/2012    Surgeries: Procedure(s): TOTAL HIP ARTHROPLASTY on 07/01/2012   Consultants (if any):    Discharged Condition: Improved  Hospital Course: Jason Sanchez is an 47 y.o. male who was admitted 07/01/2012 with a diagnosis of Avascular necrosis of bone of right hip and went to the operating room on 07/01/2012 and underwent the above named procedures.    He was given perioperative antibiotics:      Anti-infectives   Start     Dose/Rate Route Frequency Ordered Stop   07/01/12 2000  vancomycin (VANCOCIN) IVPB 1000 mg/200 mL premix     1,000 mg 200 mL/hr over 60 Minutes Intravenous Every 12 hours 07/01/12 1132 07/01/12 2125   07/01/12 0600  vancomycin (VANCOCIN) IVPB 1000 mg/200 mL premix     1,000 mg 200 mL/hr over 60 Minutes Intravenous On call to O.R. 06/30/12 1424 07/01/12 0721    .  He was given sequential compression devices, early ambulation, and lovenox for DVT prophylaxis.  He benefited maximally from the hospital stay and there were no complications.    Recent vital signs:  Filed Vitals:   07/03/12 0753  BP:   Pulse:   Temp:   Resp: 16    Recent laboratory studies:  Lab Results  Component Value Date   HGB 9.2* 07/03/2012   HGB 10.4* 07/02/2012   HGB 13.2 07/01/2012   Lab Results  Component Value Date   WBC 10.3 07/03/2012   PLT 79* 07/03/2012   Lab Results  Component Value Date   INR 1.09 07/01/2012   Lab Results  Component Value Date   NA 134* 07/03/2012   K 4.1 07/03/2012   CL 100 07/03/2012   CO2 29  07/03/2012   BUN 12 07/03/2012   CREATININE 1.19 07/03/2012   GLUCOSE 109* 07/03/2012    Discharge Medications:     Medication List    STOP taking these medications       celecoxib 200 MG capsule  Commonly known as:  CELEBREX     oxyCODONE-acetaminophen 5-325 MG per tablet  Commonly known as:  PERCOCET/ROXICET      TAKE these medications       enoxaparin 40 MG/0.4ML injection  Commonly known as:  LOVENOX  Inject 0.4 mLs (40 mg total) into the skin daily.     methocarbamol 500 MG tablet  Commonly known as:  ROBAXIN  Take 1 tablet (500 mg total) by mouth 4 (four) times daily.     oxyCODONE-acetaminophen 10-325 MG per tablet  Commonly known as:  PERCOCET  Take 1-2 tablets by mouth every 6 (six) hours as needed for pain. MAXIMUM TOTAL ACETAMINOPHEN DOSE IS 4000 MG PER DAY     sennosides-docusate sodium 8.6-50 MG tablet  Commonly known as:  SENOKOT-S  Take 2 tablets by mouth daily.        Diagnostic Studies: Dg Pelvis Portable  07/01/2012   *RADIOLOGY REPORT*  Clinical Data: History of postoperative evaluation.  Post right hip arthroplasty.  PORTABLE PELVIS  Comparison: Previous study of same date.  Findings: Right hip arthroplasty procedure has been performed. There is no evidence of dislocation or disruption of hardware.  IMPRESSION: Right hip arthroplasty procedure has been performed.  No evidence of dislocation or disruption of hardware.   Original Report Authenticated By: Onalee Hua Call   Dg Hip Portable 1 View Right  07/01/2012   *RADIOLOGY REPORT*  Clinical Data: Postoperative evaluation.  PORTABLE RIGHT HIP - 1 VIEW  Comparison: None.  Findings: Cross-table portable image is submitted of the right hip. Right hip arthroplasty has been performed.  There is expected relationship between the acetabular and femoral components.  No dislocation or disruption of hardware is evident.  IMPRESSION: Right hip arthroplasty has been performed.  No dislocation or disruption of hardware is  evident.   Original Report Authenticated By: Onalee Hua Call    Disposition: Final discharge disposition not confirmed  Discharge Orders   Future Orders Complete By Expires     Call MD / Call 911  As directed     Comments:      If you experience chest pain or shortness of breath, CALL 911 and be transported to the hospital emergency room.  If you develope a fever above 101 F, pus (white drainage) or increased drainage or redness at the wound, or calf pain, call your surgeon's office.    Change dressing  As directed     Comments:      You may change your dressing in 3 days, then change the dressing daily with sterile 4 x 4 inch gauze dressing and paper tape.  You may clean the incision with alcohol prior to redressing    Constipation Prevention  As directed     Comments:      Drink plenty of fluids.  Prune juice may be helpful.  You may use a stool softener, such as Colace (over the counter) 100 mg twice a day.  Use MiraLax (over the counter) for constipation as needed.    Diet general  As directed     Discharge instructions  As directed     Comments:      Change dressing in 3 days and reapply fresh dressing, unless you have a splint (half cast).  If you have a splint/cast, just leave in place until your follow-up appointment.    Keep wounds dry for 3 weeks.  Leave steri-strips in place on skin.  Do not apply lotion or anything to the wound.    Follow the hip precautions as taught in Physical Therapy  As directed     Posterior total hip precautions  As directed     TED hose  As directed     Comments:      Use stockings (TED hose) for 2 weeks on both leg(s).  You may remove them at night for sleeping.    Weight bearing as tolerated  As directed        Follow-up Information   Follow up with Eulas Post, MD. Schedule an appointment as soon as possible for a visit in 2 weeks.   Contact information:   [address] [place] [address] [phone]         Signed: Eulas Post 07/03/2012, 11:48 AM

## 2012-07-03 LAB — BASIC METABOLIC PANEL
BUN: 12 mg/dL (ref 6–23)
Chloride: 100 mEq/L (ref 96–112)
GFR calc non Af Amer: 71 mL/min — ABNORMAL LOW (ref >90)
Glucose, Bld: 109 mg/dL — ABNORMAL HIGH (ref 70–99)
Potassium: 4.1 mEq/L (ref 3.5–5.1)

## 2012-07-03 LAB — CBC
HCT: 28.2 % — ABNORMAL LOW (ref 39.0–52.0)
Hemoglobin: 9.2 g/dL — ABNORMAL LOW (ref 13.0–17.0)
MCHC: 32.6 g/dL (ref 30.0–36.0)

## 2012-07-03 NOTE — Progress Notes (Signed)
Occupational Therapy Treatment Patient Details Name: Philemon Riedesel MRN: [id number] DOB: 1965-07-10 Today's Date: 07/03/2012 Time: 8657-8469 OT Time Calculation (min): 45 min  OT Assessment / Plan / Recommendation  OT comments  Pt performing at a supervision level in mobility and ADL.  Educated in use of DME, AE, and posterior hip precautions.  Pt is ready for d/c home with wife.  Follow Up Recommendations       Barriers to Discharge       Equipment Recommendations       Recommendations for Other Services    Frequency     Progress towards OT Goals Progress towards OT goals: Progressing toward goals  Plan Discharge plan remains appropriate    Precautions / Restrictions Precautions Precautions: Posterior Hip;Fall Precaution Comments: able to state 1/3, reviewed Restrictions RLE Weight Bearing: Weight bearing as tolerated   Pertinent Vitals/Pain No c/o pain in hip, premedicated, iced    ADL  Grooming: Wash/dry hands;Supervision/safety Where Assessed - Grooming: Unsupported standing Toilet Transfer: Radiographer, therapeutic Method: Sit to Barista: Raised toilet seat with arms (or 3-in-1 over toilet) Toileting - Clothing Manipulation and Hygiene: Supervision/safety Where Assessed - Engineer, mining and Hygiene: Standing Tub/Shower Transfer: Therapist, sports Method: Science writer: Counsellor Used: Gait belt;Rolling walker;Sock aid;Long-handled shoe horn;Long-handled sponge;Reacher Transfers/Ambulation Related to ADLs: supervision with RW ADL Comments: Practiced use of AE for LB ADL.  Case manager aware of need for home.    OT Diagnosis:    OT Problem List:   OT Treatment Interventions:     OT Goals(current goals can now be found in the care plan section) ADL Goals Pt Will Perform Grooming: with modified independence;standing Pt Will Perform Lower Body Bathing:  with modified independence;with adaptive equipment;sit to/from stand Pt Will Perform Lower Body Dressing: with modified independence;with adaptive equipment;sit to/from stand Pt Will Transfer to Toilet: with modified independence;ambulating;bedside commode Pt Will Perform Tub/Shower Transfer: Shower transfer;with supervision;ambulating;3 in 1;rolling walker Additional ADL Goal #1: Pt will state 3 of 3 posterior hip precautions independently.  Visit Information  Last OT Received On: 07/03/12 Assistance Needed: +1 History of Present Illness: s/p right THA due to AVN     Subjective Data      Prior Functioning       Cognition  Cognition Arousal/Alertness: Awake/alert Behavior During Therapy: WFL for tasks assessed/performed Overall Cognitive Status: Within Functional Limits for tasks assessed Memory: Decreased recall of precautions    Mobility  Bed Mobility Bed Mobility: Sit to Supine;Supine to Sit Supine to Sit: 5: Supervision Details for Bed Mobility Assistance: increased time to move R LE out of bed Transfers Sit to Stand: 5: Supervision Stand to Sit: 5: Supervision Details for Transfer Assistance: cues for hand placement and R LE placement    Exercises      Balance     End of Session OT - End of Session Activity Tolerance: Patient tolerated treatment well Patient left: in chair;with call bell/phone within reach  GO     Evern Bio 07/03/2012, 9:55 AM [phone]

## 2012-07-03 NOTE — Progress Notes (Signed)
Physical Therapy Treatment Patient Details Name: Braison Snoke MRN: [id number] DOB: 05/08/1965 Today's Date: 07/03/2012 Time:  -     PT Assessment / Plan / Recommendation  PT Comments   Patient ambulated with wife present. Progressing well with ambulation. Declined further stair training. Eager to DC home today  Follow Up Recommendations  Home health PT     Does the patient have the potential to tolerate intense rehabilitation     Barriers to Discharge        Equipment Recommendations  Rolling walker with 5" wheels;3in1 (PT)    Recommendations for Other Services    Frequency 7X/week   Progress towards PT Goals Progress towards PT goals: Progressing toward goals  Plan Current plan remains appropriate    Precautions / Restrictions Precautions Precautions: Posterior Hip;Fall Restrictions RLE Weight Bearing: Weight bearing as tolerated   Pertinent Vitals/Pain no apparent distress     Mobility  Bed Mobility Bed Mobility: Not assessed Transfers Sit to Stand: 6: Modified independent (Device/Increase time) Stand to Sit: 6: Modified independent (Device/Increase time) Ambulation/Gait Ambulation/Gait Assistance: 6: Modified independent (Device/Increase time) Ambulation Distance (Feet): 300 Feet Assistive device: Rolling walker Gait Pattern: Step-through pattern Gait velocity: increasing    Exercises Total Joint Exercises Heel Slides: AAROM;Right;10 reps Hip ABduction/ADduction: AAROM;Right;10 reps Long Arc Quad: AROM;Right;10 reps   PT Diagnosis:    PT Problem List:   PT Treatment Interventions:     PT Goals (current goals can now be found in the care plan section)    Visit Information  Last PT Received On: 07/03/12 Assistance Needed: +1    Subjective Data      Cognition  Cognition Arousal/Alertness: Awake/alert Behavior During Therapy: WFL for tasks assessed/performed Overall Cognitive Status: Within Functional Limits for tasks assessed    Balance     End  of Session PT - End of Session Equipment Utilized During Treatment: Gait belt Activity Tolerance: Patient tolerated treatment well Patient left: in chair;with call bell/phone within reach Nurse Communication: Mobility status   GP     Fredrich Birks 07/03/2012, 1:42 PM 07/03/2012 Fredrich Birks PTA [phone] pager [phone] office

## 2012-07-03 NOTE — Progress Notes (Signed)
Patient ID: Jason Sanchez, male   DOB: [birth year], 47 y.o.   MRN: [id number]     Subjective:  Patient reports pain as mild to moderate.  Patient states that his pain is better today and would like to go home   Objective:   VITALS:   Filed Vitals:   07/03/12 0000 07/03/12 0140 07/03/12 0400 07/03/12 0553  BP:    118/71  Pulse:    86  Temp:  99.2 F (37.3 C)  100.4 F (38 C)  TempSrc:  Oral  Oral  Resp: 16  16 16   SpO2: 99%  99% 99%    ABD soft Sensation intact distally Dorsiflexion/Plantar flexion intact Incision: dressing C/D/I and no drainage   Lab Results  Component Value Date   WBC 10.3 07/03/2012   HGB 9.2* 07/03/2012   HCT 28.2* 07/03/2012   MCV 71.8* 07/03/2012   PLT 79* 07/03/2012     Assessment/Plan: 2 Days Post-Op   Principal Problem:   Avascular necrosis of bone of right hip   Advance diet Continue PT DC Home with home health   Haskel Khan 07/03/2012, 7:46 AM   Teryl Lucy, MD Cell [phone]

## 2012-07-03 NOTE — Progress Notes (Signed)
Physical Therapy Treatment Patient Details Name: Jason Sanchez MRN: [id number] DOB: 1965/08/22 Today's Date: 07/03/2012 Time: 6295-2841 PT Time Calculation (min): 28 min  PT Assessment / Plan / Recommendation  PT Comments   Patient is making good progress with PT.  From a mobility standpoint anticipate patient will be ready for DC home today after second session of PT .     Follow Up Recommendations  Home health PT     Does the patient have the potential to tolerate intense rehabilitation     Barriers to Discharge        Equipment Recommendations  Rolling walker with 5" wheels;3in1 (PT)    Recommendations for Other Services    Frequency 7X/week   Progress towards PT Goals Progress towards PT goals: Progressing toward goals  Plan Current plan remains appropriate    Precautions / Restrictions Precautions Precautions: Posterior Hip;Fall Precaution Comments: Reviewed 3/3 hip posterior hip precautions Restrictions RLE Weight Bearing: Weight bearing as tolerated   Pertinent Vitals/Pain no apparent distress     Mobility  Bed Mobility Supine to Sit: 5: Supervision Details for Bed Mobility Assistance: increased time to move R LE out of bed Transfers Sit to Stand: 5: Supervision Stand to Sit: 5: Supervision Details for Transfer Assistance: cues for hand placement and R LE placement Ambulation/Gait Ambulation/Gait Assistance: 5: Supervision Ambulation Distance (Feet): 200 Feet Assistive device: Rolling walker Gait Pattern: Step-through pattern;Decreased stride length Gait velocity: increasing Stairs: Yes Stairs Assistance: 4: Min assist Stairs Assistance Details (indicate cue type and reason): Min A for RW. Cues for technique Stair Management Technique: Backwards;No rails;With walker Number of Stairs: 4 Wheelchair Mobility Wheelchair Mobility: No    Exercises     PT Diagnosis:    PT Problem List:   PT Treatment Interventions:     PT Goals (current goals can now be  found in the care plan section)    Visit Information  Last PT Received On: 07/03/12 Assistance Needed: +1 History of Present Illness: s/p right THA due to AVN     Subjective Data      Cognition  Cognition Arousal/Alertness: Awake/alert Behavior During Therapy: WFL for tasks assessed/performed Overall Cognitive Status: Within Functional Limits for tasks assessed    Balance     End of Session PT - End of Session Equipment Utilized During Treatment: Gait belt Activity Tolerance: Patient tolerated treatment well Patient left: in chair;with call bell/phone within reach Nurse Communication: Mobility status   GP     Fredrich Birks 07/03/2012, 9:38 AM 07/03/2012 Fredrich Birks PTA [phone] pager [phone] office

## 2013-02-09 ENCOUNTER — Encounter (HOSPITAL_BASED_OUTPATIENT_CLINIC_OR_DEPARTMENT_OTHER): Payer: Self-pay | Admitting: *Deleted

## 2013-02-09 NOTE — Progress Notes (Signed)
Pt had fx hip cone 6/14-no labs needed

## 2013-02-12 ENCOUNTER — Other Ambulatory Visit: Payer: Self-pay | Admitting: Orthopedic Surgery

## 2013-02-13 ENCOUNTER — Encounter (HOSPITAL_BASED_OUTPATIENT_CLINIC_OR_DEPARTMENT_OTHER)
Admission: RE | Disposition: A | Discharge status: Home / Self Care | Payer: Self-pay | Source: Ambulatory Visit | Attending: Orthopedic Surgery

## 2013-02-13 ENCOUNTER — Ambulatory Visit (HOSPITAL_BASED_OUTPATIENT_CLINIC_OR_DEPARTMENT_OTHER): Payer: Worker's Compensation | Admitting: Anesthesiology

## 2013-02-13 ENCOUNTER — Encounter (HOSPITAL_BASED_OUTPATIENT_CLINIC_OR_DEPARTMENT_OTHER): Payer: Self-pay | Admitting: *Deleted

## 2013-02-13 ENCOUNTER — Encounter (HOSPITAL_BASED_OUTPATIENT_CLINIC_OR_DEPARTMENT_OTHER): Payer: Worker's Compensation | Admitting: Anesthesiology

## 2013-02-13 ENCOUNTER — Ambulatory Visit (HOSPITAL_BASED_OUTPATIENT_CLINIC_OR_DEPARTMENT_OTHER)
Admission: RE | Admit: 2013-02-13 | Discharge: 2013-02-13 | Disposition: A | Discharge status: Home / Self Care | Payer: Worker's Compensation | Source: Ambulatory Visit | Attending: Orthopedic Surgery | Admitting: Orthopedic Surgery

## 2013-02-13 DIAGNOSIS — IMO0001 Reserved for inherently not codable concepts without codable children: Secondary | ICD-10-CM | POA: Insufficient documentation

## 2013-02-13 DIAGNOSIS — W19XXXS Unspecified fall, sequela: Secondary | ICD-10-CM | POA: Insufficient documentation

## 2013-02-13 DIAGNOSIS — M129 Arthropathy, unspecified: Secondary | ICD-10-CM | POA: Insufficient documentation

## 2013-02-13 DIAGNOSIS — Z880 Allergy status to penicillin: Secondary | ICD-10-CM | POA: Insufficient documentation

## 2013-02-13 DIAGNOSIS — Z96649 Presence of unspecified artificial hip joint: Secondary | ICD-10-CM | POA: Insufficient documentation

## 2013-02-13 DIAGNOSIS — K219 Gastro-esophageal reflux disease without esophagitis: Secondary | ICD-10-CM | POA: Insufficient documentation

## 2013-02-13 DIAGNOSIS — M2342 Loose body in knee, left knee: Secondary | ICD-10-CM

## 2013-02-13 DIAGNOSIS — M2240 Chondromalacia patellae, unspecified knee: Secondary | ICD-10-CM | POA: Insufficient documentation

## 2013-02-13 HISTORY — PX: KNEE ARTHROSCOPY: SHX127

## 2013-02-13 HISTORY — DX: Loose body in knee, left knee: M23.42

## 2013-02-13 LAB — POCT HEMOGLOBIN-HEMACUE: Hemoglobin: 14.2 g/dL (ref 13.0–17.0)

## 2013-02-13 SURGERY — ARTHROSCOPY, KNEE
Anesthesia: General | Site: Knee | Laterality: Left

## 2013-02-13 MED ORDER — OXYCODONE-ACETAMINOPHEN 10-325 MG PO TABS: 1.0000 | ORAL_TABLET | Freq: Four times a day (QID) | ORAL | Status: AC | PRN

## 2013-02-13 MED ORDER — HYDROMORPHONE HCL PF 1 MG/ML IJ SOLN
0.2500 mg | INTRAMUSCULAR | Status: DC | PRN
  Administered 2013-02-13: 0.5 mg via INTRAVENOUS

## 2013-02-13 MED ORDER — BUPIVACAINE HCL (PF) 0.25 % IJ SOLN
INTRAMUSCULAR | Status: AC
  Filled 2013-02-13: qty 30

## 2013-02-13 MED ORDER — PROPOFOL 10 MG/ML IV BOLUS
INTRAVENOUS | Status: DC | PRN
  Administered 2013-02-13: 250 mg via INTRAVENOUS

## 2013-02-13 MED ORDER — DEXAMETHASONE SODIUM PHOSPHATE 4 MG/ML IJ SOLN
INTRAMUSCULAR | Status: DC | PRN
  Administered 2013-02-13: 10 mg via INTRAVENOUS

## 2013-02-13 MED ORDER — CEFAZOLIN SODIUM-DEXTROSE 2-3 GM-% IV SOLR
2.0000 g | INTRAVENOUS | Status: AC
  Administered 2013-02-13: 2 g via INTRAVENOUS

## 2013-02-13 MED ORDER — KETOROLAC TROMETHAMINE 10 MG PO TABS: 10.0000 mg | ORAL_TABLET | Freq: Four times a day (QID) | ORAL | Status: AC | PRN

## 2013-02-13 MED ORDER — LACTATED RINGERS IV SOLN
INTRAVENOUS | Status: DC
  Administered 2013-02-13: 10:00:00 via INTRAVENOUS

## 2013-02-13 MED ORDER — ONDANSETRON HCL 4 MG/2ML IJ SOLN: 4.0000 mg | Freq: Once | INTRAMUSCULAR | Status: DC | PRN

## 2013-02-13 MED ORDER — MIDAZOLAM HCL 2 MG/2ML IJ SOLN
INTRAMUSCULAR | Status: AC
  Filled 2013-02-13: qty 2

## 2013-02-13 MED ORDER — MIDAZOLAM HCL 2 MG/2ML IJ SOLN: 1.0000 mg | INTRAMUSCULAR | Status: DC | PRN

## 2013-02-13 MED ORDER — MIDAZOLAM HCL 5 MG/5ML IJ SOLN
INTRAMUSCULAR | Status: DC | PRN
  Administered 2013-02-13: 2 mg via INTRAVENOUS

## 2013-02-13 MED ORDER — OXYCODONE HCL 5 MG/5ML PO SOLN
5.0000 mg | Freq: Once | ORAL | Status: AC | PRN
Start: 2013-02-13 — End: 2013-02-13

## 2013-02-13 MED ORDER — HYDROMORPHONE HCL PF 1 MG/ML IJ SOLN
INTRAMUSCULAR | Status: AC
  Filled 2013-02-13: qty 1

## 2013-02-13 MED ORDER — FENTANYL CITRATE 0.05 MG/ML IJ SOLN
INTRAMUSCULAR | Status: DC | PRN
  Administered 2013-02-13: 50 ug via INTRAVENOUS
  Administered 2013-02-13: 100 ug via INTRAVENOUS

## 2013-02-13 MED ORDER — BUPIVACAINE HCL (PF) 0.5 % IJ SOLN
INTRAMUSCULAR | Status: DC | PRN
  Administered 2013-02-13: 20 mL via INTRA_ARTICULAR

## 2013-02-13 MED ORDER — PROMETHAZINE HCL 25 MG PO TABS: 25.0000 mg | ORAL_TABLET | Freq: Four times a day (QID) | ORAL | Status: AC | PRN

## 2013-02-13 MED ORDER — SODIUM CHLORIDE 0.9 % IR SOLN
Status: DC | PRN
  Administered 2013-02-13: 2500 mL

## 2013-02-13 MED ORDER — FENTANYL CITRATE 0.05 MG/ML IJ SOLN
INTRAMUSCULAR | Status: AC
  Filled 2013-02-13: qty 4

## 2013-02-13 MED ORDER — LIDOCAINE HCL (CARDIAC) 20 MG/ML IV SOLN
INTRAVENOUS | Status: DC | PRN
  Administered 2013-02-13: 100 mg via INTRAVENOUS

## 2013-02-13 MED ORDER — FENTANYL CITRATE 0.05 MG/ML IJ SOLN: 50.0000 ug | INTRAMUSCULAR | Status: DC | PRN

## 2013-02-13 MED ORDER — OXYCODONE HCL 5 MG PO TABS
5.0000 mg | ORAL_TABLET | Freq: Once | ORAL | Status: AC | PRN
  Administered 2013-02-13: 5 mg via ORAL
  Filled 2013-02-13: qty 1

## 2013-02-13 MED ORDER — METHOCARBAMOL 500 MG PO TABS: 500.0000 mg | ORAL_TABLET | Freq: Four times a day (QID) | ORAL | Status: AC

## 2013-02-13 MED ORDER — CEFAZOLIN SODIUM-DEXTROSE 2-3 GM-% IV SOLR
INTRAVENOUS | Status: AC
  Filled 2013-02-13: qty 50

## 2013-02-13 MED ORDER — BUPIVACAINE HCL (PF) 0.5 % IJ SOLN
INTRAMUSCULAR | Status: AC
  Filled 2013-02-13: qty 30

## 2013-02-13 SURGICAL SUPPLY — 46 items
BANDAGE ELASTIC 6 VELCRO ST LF (GAUZE/BANDAGES/DRESSINGS) ×3 IMPLANT
BANDAGE ESMARK 6X9 LF (GAUZE/BANDAGES/DRESSINGS) IMPLANT
BENZOIN TINCTURE PRP APPL 2/3 (GAUZE/BANDAGES/DRESSINGS) ×3 IMPLANT
BLADE CUTTER GATOR 3.5 (BLADE) ×3 IMPLANT
BNDG ESMARK 6X9 LF (GAUZE/BANDAGES/DRESSINGS)
CANISTER SUCT 3000ML (MISCELLANEOUS) IMPLANT
CLOSURE WOUND 1/2 X4 (GAUZE/BANDAGES/DRESSINGS) ×1
CUFF TOURNIQUET SINGLE 34IN LL (TOURNIQUET CUFF) IMPLANT
CUTTER KNOT PUSHER 2-0 FIBERWI (INSTRUMENTS) IMPLANT
CUTTER MENISCUS  4.2MM (BLADE)
CUTTER MENISCUS 4.2MM (BLADE) IMPLANT
DRAPE ARTHROSCOPY W/POUCH 90 (DRAPES) ×3 IMPLANT
DURAPREP 26ML APPLICATOR (WOUND CARE) ×3 IMPLANT
ELECT MENISCUS 165MM 90D (ELECTRODE) IMPLANT
ELECT REM PT RETURN 9FT ADLT (ELECTROSURGICAL)
ELECTRODE REM PT RTRN 9FT ADLT (ELECTROSURGICAL) IMPLANT
GLOVE BIO SURGEON STRL SZ 6.5 (GLOVE) ×2 IMPLANT
GLOVE BIO SURGEON STRL SZ8 (GLOVE) ×3 IMPLANT
GLOVE BIO SURGEONS STRL SZ 6.5 (GLOVE) ×1
GLOVE BIOGEL PI IND STRL 7.0 (GLOVE) ×4 IMPLANT
GLOVE BIOGEL PI IND STRL 8 (GLOVE) ×2 IMPLANT
GLOVE BIOGEL PI INDICATOR 7.0 (GLOVE) ×8
GLOVE BIOGEL PI INDICATOR 8 (GLOVE) ×4
GLOVE ECLIPSE 6.5 STRL STRAW (GLOVE) ×3 IMPLANT
GLOVE ORTHO TXT STRL SZ7.5 (GLOVE) ×3 IMPLANT
GLOVE SURG SS PI 7.0 STRL IVOR (GLOVE) ×3 IMPLANT
GOWN STRL REUS W/ TWL LRG LVL3 (GOWN DISPOSABLE) ×2 IMPLANT
GOWN STRL REUS W/ TWL XL LVL3 (GOWN DISPOSABLE) ×3 IMPLANT
GOWN STRL REUS W/TWL LRG LVL3 (GOWN DISPOSABLE) ×4
GOWN STRL REUS W/TWL XL LVL3 (GOWN DISPOSABLE) ×6
HOLDER KNEE FOAM BLUE (MISCELLANEOUS) IMPLANT
KNEE WRAP E Z 3 GEL PACK (MISCELLANEOUS) ×3 IMPLANT
MANIFOLD NEPTUNE II (INSTRUMENTS) ×3 IMPLANT
PACK ARTHROSCOPY DSU (CUSTOM PROCEDURE TRAY) ×3 IMPLANT
PACK BASIN DAY SURGERY FS (CUSTOM PROCEDURE TRAY) ×3 IMPLANT
PENCIL BUTTON HOLSTER BLD 10FT (ELECTRODE) IMPLANT
SET ARTHROSCOPY TUBING (MISCELLANEOUS) ×2
SET ARTHROSCOPY TUBING LN (MISCELLANEOUS) ×1 IMPLANT
SHEET MEDIUM DRAPE 40X70 STRL (DRAPES) ×3 IMPLANT
SLEEVE SCD COMPRESS KNEE MED (MISCELLANEOUS) ×3 IMPLANT
SPONGE GAUZE 4X4 12PLY (GAUZE/BANDAGES/DRESSINGS) ×3 IMPLANT
STRIP CLOSURE SKIN 1/2X4 (GAUZE/BANDAGES/DRESSINGS) ×2 IMPLANT
SUT MNCRL AB 4-0 PS2 18 (SUTURE) ×3 IMPLANT
TOWEL OR 17X24 6PK STRL BLUE (TOWEL DISPOSABLE) ×3 IMPLANT
WAND STAR VAC 90 (SURGICAL WAND) IMPLANT
WATER STERILE IRR 1000ML POUR (IV SOLUTION) ×3 IMPLANT

## 2013-02-13 NOTE — Discharge Instructions (Signed)
Diet: As you were doing prior to hospitalization  ° °Shower:  May shower but keep the wounds dry, use an occlusive plastic wrap, NO SOAKING IN TUB.  If the bandage gets wet, change with a clean dry gauze. ° °Dressing:  You may change your dressing 3-5 days after surgery.  Then change the dressing daily with sterile gauze dressing.   ° °There are sticky tapes (steri-strips) on your wounds and all the stitches are absorbable.  Leave the steri-strips in place when changing your dressings, they will peel off with time, usually 2-3 weeks. ° °Activity:  Increase activity slowly as tolerated, but follow the weight bearing instructions below.  No lifting or driving for 6 weeks. ° °Weight Bearing:   As tolerated.   ° °To prevent constipation: you may use a stool softener such as - ° °Colace (over the counter) 100 mg by mouth twice a day  °Drink plenty of fluids (prune juice may be helpful) and high fiber foods °Miralax (over the counter) for constipation as needed.   ° °Itching:  If you experience itching with your medications, try taking only a single pain pill, or even half a pain pill at a time.  You may take up to 10 pain pills per day, and you can also use benadryl over the counter for itching or also to help with sleep.  ° °Precautions:  If you experience chest pain or shortness of breath - call 911 immediately for transfer to the hospital emergency department!! ° °If you develop a fever greater that 101 F, purulent drainage from wound, increased redness or drainage from wound, or calf pain -- Call the office at [phone]                                                °Follow- Up Appointment:  Please call for an appointment to be seen in 2 weeks [place] - [phone] ° ° ° ° ° °Post Anesthesia Home Care Instructions ° °Activity: °Get plenty of rest for the remainder of the day. A responsible adult should stay with you for 24 hours following the procedure.  °For the next 24 hours, DO NOT: °-Drive a car °-Operate  machinery °-Drink alcoholic beverages °-Take any medication unless instructed by your physician °-Make any legal decisions or sign important papers. ° °Meals: °Start with liquid foods such as gelatin or soup. Progress to regular foods as tolerated. Avoid greasy, spicy, heavy foods. If nausea and/or vomiting occur, drink only clear liquids until the nausea and/or vomiting subsides. Call your physician if vomiting continues. ° °Special Instructions/Symptoms: °Your throat may feel dry or sore from the anesthesia or the breathing tube placed in your throat during surgery. If this causes discomfort, gargle with warm salt water. The discomfort should disappear within 24 hours. ° °

## 2013-02-13 NOTE — Transfer of Care (Signed)
Immediate Anesthesia Transfer of Care Note  Patient: Jason Sanchez  Procedure(s) Performed: Procedure(s): LEFT ARTHROSCOPY KNEE WITH CHONDROPLASTY AND REMOVAL OF LOOSE BODIES (Left)  Patient Location: PACU  Anesthesia Type:General  Level of Consciousness: sedated and patient cooperative  Airway & Oxygen Therapy: Patient Spontanous Breathing and Patient connected to face mask oxygen  Post-op Assessment: Report given to PACU RN and Post -op Vital signs reviewed and stable  Post vital signs: Reviewed and stable  Complications: No apparent anesthesia complications

## 2013-02-13 NOTE — Anesthesia Postprocedure Evaluation (Signed)
  Anesthesia Post-op Note  Patient: Jason Sanchez  Procedure(s) Performed: Procedure(s): LEFT ARTHROSCOPY KNEE WITH CHONDROPLASTY AND REMOVAL OF LOOSE BODIES (Left)  Patient Location: PACU  Anesthesia Type:General  Level of Consciousness: awake, alert  and oriented  Airway and Oxygen Therapy: Patient Spontanous Breathing  Post-op Pain: mild  Post-op Assessment: Post-op Vital signs reviewed  Post-op Vital Signs: Reviewed  Complications: No apparent anesthesia complications

## 2013-02-13 NOTE — Anesthesia Procedure Notes (Signed)
Procedure Name: LMA Insertion Date/Time: 02/13/2013 10:34 AM Performed by: Gar GibbonKEETON, Tamyka Bezio S Pre-anesthesia Checklist: Patient identified, Emergency Drugs available, Suction available and Patient being monitored Patient Re-evaluated:Patient Re-evaluated prior to inductionOxygen Delivery Method: Circle System Utilized Preoxygenation: Pre-oxygenation with 100% oxygen Intubation Type: IV induction Ventilation: Mask ventilation without difficulty LMA: LMA inserted LMA Size: 5.0 Number of attempts: 1 Airway Equipment and Method: bite block Placement Confirmation: positive ETCO2 Tube secured with: Tape Dental Injury: Teeth and Oropharynx as per pre-operative assessment

## 2013-02-13 NOTE — Anesthesia Preprocedure Evaluation (Signed)
Anesthesia Evaluation  Patient identified by MRN, date of birth, ID band Patient awake    Reviewed: Allergy & Precautions, H&P , NPO status , Patient's Chart, lab work & pertinent test results  Airway Mallampati: I TM Distance: >3 FB Neck ROM: Full    Dental  (+) Teeth Intact and Dental Advisory Given   Pulmonary  breath sounds clear to auscultation        Cardiovascular Rhythm:Regular     Neuro/Psych    GI/Hepatic GERD-  Medicated and Controlled,  Endo/Other    Renal/GU      Musculoskeletal   Abdominal   Peds  Hematology   Anesthesia Other Findings   Reproductive/Obstetrics                           Anesthesia Physical Anesthesia Plan  ASA: II  Anesthesia Plan: General   Post-op Pain Management:    Induction: Intravenous  Airway Management Planned: LMA  Additional Equipment:   Intra-op Plan:   Post-operative Plan: Extubation in OR  Informed Consent: I have reviewed the patients History and Physical, chart, labs and discussed the procedure including the risks, benefits and alternatives for the proposed anesthesia with the patient or authorized representative who has indicated his/her understanding and acceptance.   Dental advisory given  Plan Discussed with: CRNA, Anesthesiologist and Surgeon  Anesthesia Plan Comments:         Anesthesia Quick Evaluation

## 2013-02-13 NOTE — H&P (Signed)
  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 male who presents for preoperative history and physical with a diagnosis of LEFT KNEE Chondromalacia of patella, possible meniscal tear . Symptoms are rated as moderate to severe, and have been worsening.  This is significantly impairing activities of daily living.  He has elected for surgical management.  This is a work-related injury, while he was recovering from his total hip arthroplasty. He has failed injections, activity modification, physical therapy, anti-inflammatories.  Past Medical History  Diagnosis Date  . Concussion   . GERD (gastroesophageal reflux disease)   . Arthritis   . Avascular necrosis of bone of right hip 07/01/2012   Past Surgical History  Procedure Laterality Date  . Ankle fusion Right 2005    pinned and screws  . Total hip arthroplasty Right 07/01/2012    Procedure: TOTAL HIP ARTHROPLASTY;  Surgeon: Eulas PostJoshua P Lillith Mcneff, MD;  Location: [facility] OR;  Service: Orthopedics;  Laterality: Right;   History   Social History  . Marital Status: Married    Spouse Name: N/A    Number of Children: N/A  . Years of Education: N/A   Social History Main Topics  . Smoking status: Never Smoker   . Smokeless tobacco: None  . Alcohol Use: No  . Drug Use: No  . Sexual Activity: None   Other Topics Concern  . None   Social History Narrative  . None   History reviewed. No pertinent family history. Allergies  Allergen Reactions  . Penicillins Hives   Prior to Admission medications   Medication Sig Start Date End Date Taking? Authorizing Provider  glucosamine-chondroitin 500-400 MG tablet Take 1 tablet by mouth 3 (three) times daily.   Yes Historical Provider, MD  Multiple Vitamins-Minerals (MULTIVITAMIN WITH MINERALS) tablet Take 1 tablet by mouth daily.   Yes Historical Provider, MD     Positive ROS: All other systems have been reviewed and were  otherwise negative with the exception of those mentioned in the HPI and as above.  Physical Exam: General: Alert, no acute distress Cardiovascular: No pedal edema Respiratory: No cyanosis, no use of accessory musculature GI: No organomegaly, abdomen is soft and non-tender Skin: No lesions in the area of chief complaint Neurologic: Sensation intact distally Psychiatric: Patient is competent for consent with normal mood and affect Lymphatic: No axillary or cervical lymphadenopathy  MUSCULOSKELETAL: Left knee has diffuse pain to palpation. Most of the pain is medially. He has basically full motion with the exception of some slight loss of flexion, 125 versus 135 on the other side. He has normal stability.  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 We also discussed the risks and that this may not improve his symptoms, particularly given that the MRI did not demonstrate structural abnormality, however there is a possibility for false negative on the MRI, and we will plan to proceed given his lack of response to conservative measures.  Eulas PostLANDAU,Yolani Vo P, MD Cell [phone]   02/13/2013 7:27 AM

## 2013-02-13 NOTE — Op Note (Signed)
02/13/2013  11:20 AM  PATIENT:  Jason Sanchez    PRE-OPERATIVE DIAGNOSIS:  LEFT KNEE Chondromalacia of patella   POST-OPERATIVE DIAGNOSIS:  Left knee femoral trochlear lesion, with multiple loose bodies, grade 2 chondral changes on the medial femoral condyle, grade 1 changes on the medial and lateral tibial plateau  PROCEDURE:  LEFT ARTHROSCOPY KNEE WITH CHONDROPLASTY OF THE MEDIAL FEMORAL CONDYLE AND THE FEMORAL TROCHLEA AND REMOVAL OF LOOSE BODIES  SURGEON:  Eulas PostLANDAU,Juliette Standre P, MD  PHYSICIAN ASSISTANT: Janace LittenBrandon Parry, OPA-C, present and scrubbed throughout the case, critical for completion in a timely fashion, and for retraction, instrumentation, and closure.  ANESTHESIA:   General  PREOPERATIVE INDICATIONS:  Jason Sanchez is a  48 y.o. male with a diagnosis of LEFT KNEE Chondromalacia of patella  who failed conservative measures and elected for surgical management.  He injured his knee while going down stairs while recovering from his contralateral hip replacement.  The risks benefits and alternatives were discussed with the patient preoperatively including but not limited to the risks of infection, bleeding, nerve injury, cardiopulmonary complications, the need for revision surgery, among others, and the patient was willing to proceed. We also discussed the risks of incomplete relief of symptoms, persistent stiffness and swelling, among others.  OPERATIVE IMPLANTS: None  OPERATIVE FINDINGS: The undersurface of the patella had reasonably well preserved cartilage surface. Deep within the femoral trochlea there was grade 3 changes that were somewhat uncontained. The medial femoral condyle had extensive grade 1 changes. The medial tibial condyle also had grade 1 and grade 2 changes. The lateral compartment had grade 1 changes on the tibial condyle. The femoral condyle was better preserved. The anterior cruciate ligament and PCL were intact. The knee had multiple loose bodies, and I think came from the  femoral trochlear lesion. The medial and lateral gutters were normal. The MCL was intact on examination under anesthesia, although it was slightly lax, between a grade 1 and grade 2 sprain. Interestingly, while examining the medial side, I felt fairly substantial pop, which I think was a loose body moving. After the pop he did not have any increased laxity to MCL testing.  OPERATIVE PROCEDURE: The patient is brought to the operating room and placed in the supine position. General anesthesia was administered. IV Ancef was given after a test dose he had no adverse reaction. The left lower extremity was prepped and draped in usual sterile fashion. Time out was performed. Diagnostic arthroscopy was carried out the above-named findings. I used the arthroscopic shaver to debride any loose bodies, and removed in entirety. These were all fairly thin, and easily removed with the shaver. I performed a light chondroplasty of the femoral trochlea, as well as of the medial compartment, and I probed both the medial and lateral meniscus thoroughly, and did not appreciate any additional loose bodies.  Instruments were removed, the portals closed with Monocryl followed by Steri-Strips and sterile gauze. He was awakened and returned to the PACU in stable and satisfactory condition. There were no complications and she tolerated the procedure well.

## 2013-02-16 ENCOUNTER — Encounter (HOSPITAL_BASED_OUTPATIENT_CLINIC_OR_DEPARTMENT_OTHER): Payer: Self-pay | Admitting: Orthopedic Surgery

## 2014-01-06 IMAGING — CR DG PORTABLE PELVIS
2 series · 2 of 2 positions shown · non-contrast
Comparison: Previous study of same date.

CLINICAL DATA: History of postoperative evaluation.  Post right hip
arthroplasty.

PORTABLE PELVIS

[AP (1 of 2)]
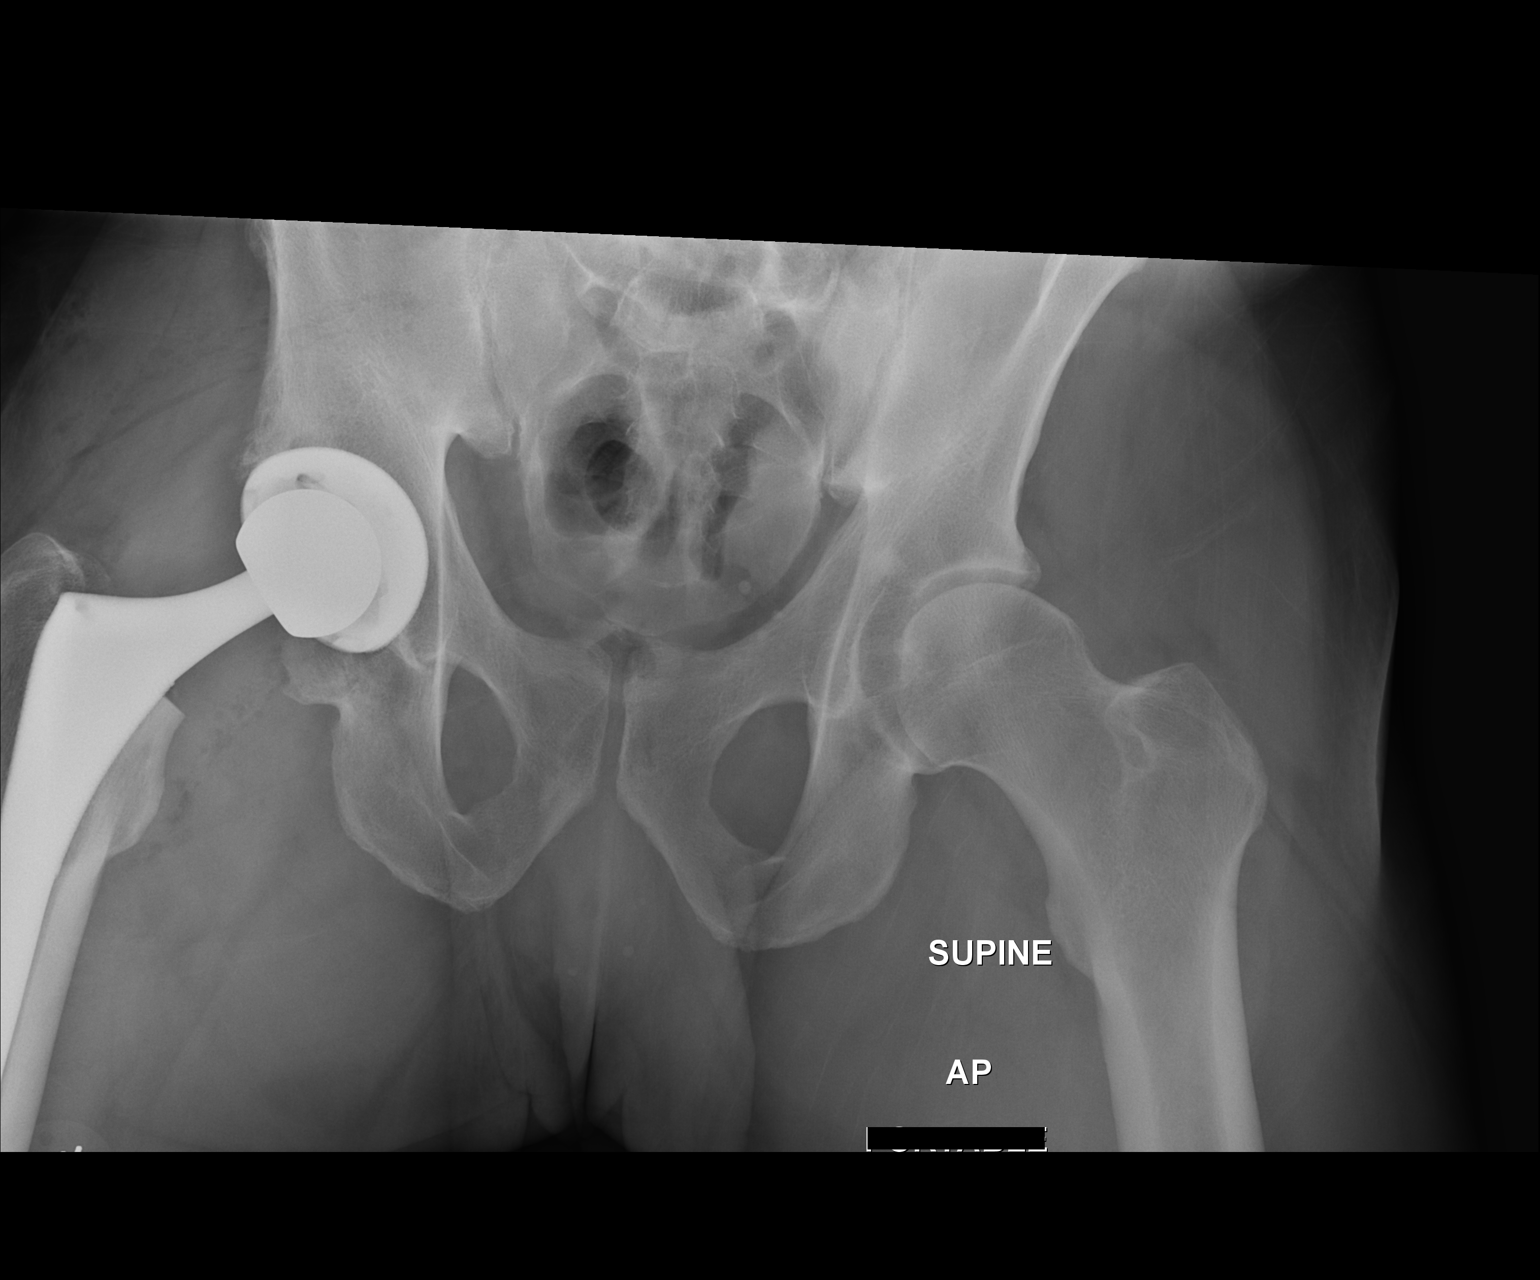

[AP (2 of 2)]
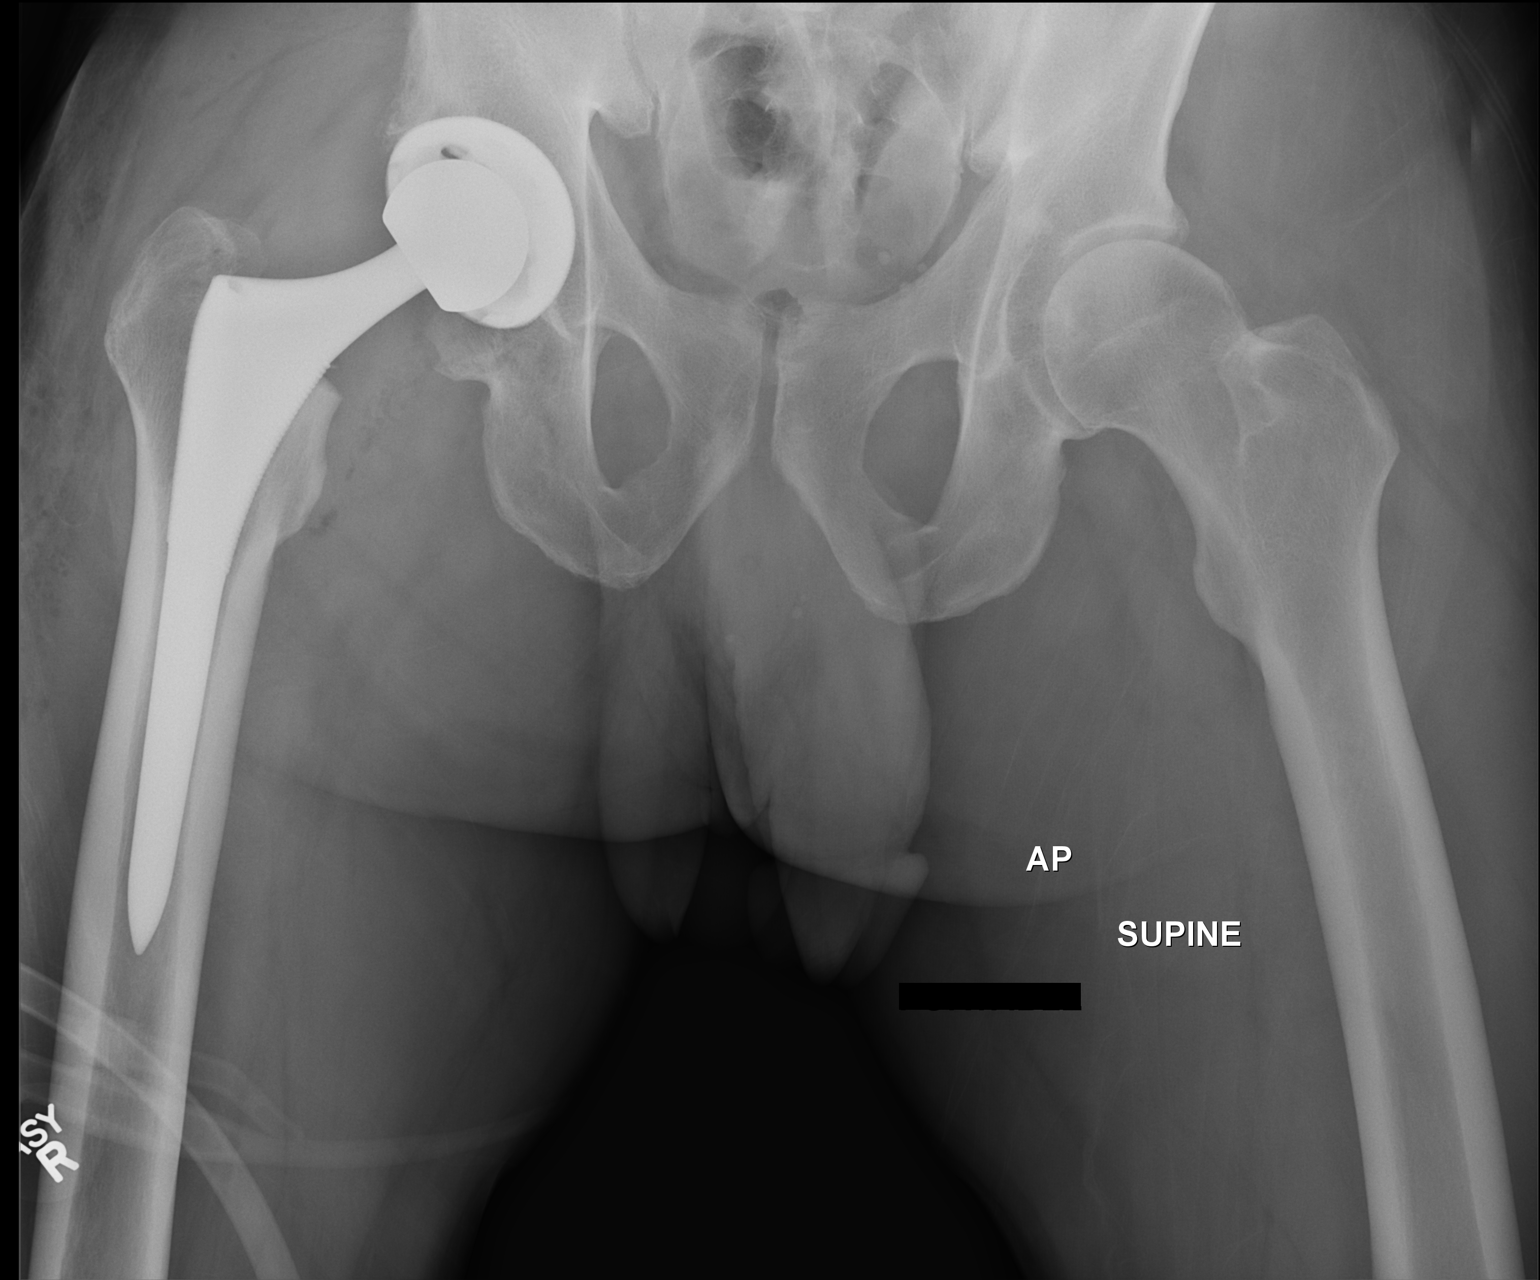

[2 of 2 positions shown; findings below may reference images not displayed]

FINDINGS: Right hip arthroplasty procedure has been performed.
There is no evidence of dislocation or disruption of hardware.
IMPRESSION: Right hip arthroplasty procedure has been performed.  No evidence
of dislocation or disruption of hardware.
